# Patient Record
Sex: Male | Born: 2000 | Race: Black or African American | Hispanic: No | Marital: Single | Smoking: Never smoker
Health system: Southern US, Community
[De-identification: ages and names within clinical notes are randomized; demographics above are authoritative.]

## PROBLEM LIST (undated history)

## (undated) DIAGNOSIS — J45909 Unspecified asthma, uncomplicated: Secondary | ICD-10-CM

---
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
## 2018-04-08 ENCOUNTER — Other Ambulatory Visit: Payer: Self-pay

## 2018-04-08 ENCOUNTER — Emergency Department (HOSPITAL_COMMUNITY)
Admission: EM | Admit: 2018-04-08 | Discharge: 2018-04-08 | Disposition: A | Discharge status: Home / Self Care | Payer: Self-pay | Attending: Emergency Medicine | Admitting: Emergency Medicine

## 2018-04-08 ENCOUNTER — Encounter (HOSPITAL_COMMUNITY): Payer: Self-pay

## 2018-04-08 DIAGNOSIS — J45909 Unspecified asthma, uncomplicated: Secondary | ICD-10-CM | POA: Insufficient documentation

## 2018-04-08 DIAGNOSIS — R69 Illness, unspecified: Secondary | ICD-10-CM

## 2018-04-08 DIAGNOSIS — J111 Influenza due to unidentified influenza virus with other respiratory manifestations: Secondary | ICD-10-CM | POA: Insufficient documentation

## 2018-04-08 LAB — GROUP A STREP BY PCR: Group A Strep by PCR: NOT DETECTED

## 2018-04-08 MED ORDER — ACETAMINOPHEN 325 MG PO TABS
650.0000 mg | ORAL_TABLET | Freq: Once | ORAL | Status: AC | PRN
  Administered 2018-04-08: 650 mg via ORAL
  Filled 2018-04-08: qty 2

## 2018-04-08 NOTE — ED Triage Notes (Signed)
Patient c/o fever, body aches, cough, and sore throat x 4 days.

## 2018-04-08 NOTE — ED Provider Notes (Signed)
[facility]-EMERGENCY DEPT Provider Note   CSN: [id number] Arrival date & time: 04/08/18  0940     History   Chief Complaint Chief Complaint  Patient presents with  . Generalized Body Aches  . Fever  . Cough  . Sore Throat    HPI Kent Kramer is a 18 y.o. male.  The history is provided by the patient.  Fever  Max temp prior to arrival:  101.4 Temp source:  Oral Severity:  Moderate Onset quality:  Gradual Duration:  4 days Timing:  Constant Progression:  Unchanged Chronicity:  New Relieved by:  Acetaminophen Worsened by:  Nothing Ineffective treatments:  None tried Associated symptoms: chills, congestion, cough, diarrhea, myalgias, rhinorrhea and sore throat   Associated symptoms: no ear pain and no vomiting   Risk factors: sick contacts   Risk factors: no recent travel   Cough  Associated symptoms: chills, fever, myalgias, rhinorrhea and sore throat   Associated symptoms: no ear pain   Sore Throat     Past Medical History:  Diagnosis Date  . Asthma     There are no active problems to display for this patient.   History reviewed. No pertinent surgical history.      Home Medications    Prior to Admission medications   Medication Sig Start Date End Date Taking? Authorizing Provider  ibuprofen (ADVIL,MOTRIN) 800 MG tablet Take 1 tablet (800 mg total) by mouth 3 (three) times daily. 10/08/13   Jillyn LedgerPalmer, Jessica K, PA-C    Family History Family History  Problem Relation Age of Onset  . Heart failure Other     Social History Social History   Tobacco Use  . Smoking status: Never Smoker  . Smokeless tobacco: Never Used  Substance Use Topics  . Alcohol use: No  . Drug use: Never     Allergies   Penicillins   Review of Systems Review of Systems  Constitutional: Positive for chills and fever.  HENT: Positive for congestion, rhinorrhea and sore throat. Negative for ear pain.   Respiratory: Positive for cough.     Gastrointestinal: Positive for diarrhea. Negative for vomiting.  Musculoskeletal: Positive for myalgias.  All other systems reviewed and are negative.    Physical Exam Updated Vital Signs BP (!) 119/51 (BP Location: Left Arm)   Pulse 92   Temp (!) 101.4 F (38.6 C) (Oral)   Resp 16   Ht 5\' 9"  (1.753 m)   Wt 136.1 kg   SpO2 100%   BMI 44.30 kg/m   Physical Exam Vitals signs and nursing note reviewed.  Constitutional:      General: He is not in acute distress.    Appearance: He is well-developed.  HENT:     Head: Normocephalic and atraumatic.     Right Ear: Tympanic membrane normal.     Nose: Congestion and rhinorrhea present.     Mouth/Throat:     Mouth: Mucous membranes are moist.     Tonsils: Tonsillar exudate present. Swelling: 1+ on the right. 1+ on the left.  Eyes:     Conjunctiva/sclera: Conjunctivae normal.     Pupils: Pupils are equal, round, and reactive to light.  Neck:     Musculoskeletal: Normal range of motion and neck supple.  Cardiovascular:     Rate and Rhythm: Normal rate and regular rhythm.     Heart sounds: No murmur.  Pulmonary:     Effort: Pulmonary effort is normal. No respiratory distress.     Breath sounds:  Normal breath sounds. No wheezing or rales.  Abdominal:     General: There is no distension.     Palpations: Abdomen is soft.     Tenderness: There is no abdominal tenderness. There is no guarding or rebound.  Musculoskeletal: Normal range of motion.        General: No tenderness.  Skin:    General: Skin is warm and dry.     Findings: No erythema or rash.  Neurological:     Mental Status: He is alert and oriented to person, place, and time.  Psychiatric:        Behavior: Behavior normal.      ED Treatments / Results  Labs (all labs ordered are listed, but only abnormal results are displayed) Labs Reviewed  GROUP A STREP BY PCR    EKG None  Radiology No results found.  Procedures Procedures (including critical care  time)  Medications Ordered in ED Medications  acetaminophen (TYLENOL) tablet 650 mg (has no administration in time range)     Initial Impression / Assessment and Plan / ED Course  I have reviewed the triage vital signs and the nursing notes.  Pertinent labs & imaging results that were available during my care of the patient were reviewed by me and considered in my medical decision making (see chart for details).    Pt with symptoms consistent with influenza vs strep.  Normal exam here but is febrile.  No signs of breathing difficulty  No signs of otitis or abnormal abdominal findings.   Rapid strep wnl.  Will continue antipyretica and rest and fluids and return for any further problems.  Treat for flu like illness   Final Clinical Impressions(s) / ED Diagnoses   Final diagnoses:  Influenza-like illness    ED Discharge Orders    None       Gwyneth Sprout, MD 04/08/18 [phone]

## 2018-04-08 NOTE — Discharge Instructions (Signed)
Continue to do Tylenol or ibuprofen for fever.  Drink plenty of fluids and rest.

## 2019-11-19 ENCOUNTER — Ambulatory Visit: Payer: Medicaid Other

## 2019-11-19 ENCOUNTER — Other Ambulatory Visit: Payer: Self-pay

## 2019-11-19 ENCOUNTER — Other Ambulatory Visit (HOSPITAL_COMMUNITY)
Admission: RE | Admit: 2019-11-19 | Discharge: 2019-11-19 | Disposition: A | Discharge status: Home / Self Care | Payer: Medicaid Other | Source: Ambulatory Visit | Attending: Infectious Diseases | Admitting: Infectious Diseases

## 2019-11-19 ENCOUNTER — Other Ambulatory Visit: Payer: Medicaid Other

## 2019-11-19 DIAGNOSIS — B20 Human immunodeficiency virus [HIV] disease: Secondary | ICD-10-CM | POA: Insufficient documentation

## 2019-11-19 LAB — CBC WITH DIFFERENTIAL/PLATELET
Basophils Relative: 0.6 %
WBC: 5.3 10*3/uL (ref 3.8–10.8)

## 2019-11-19 LAB — COMPLETE METABOLIC PANEL WITH GFR
ALT: 25 U/L (ref 9–46)
Sodium: 137 mmol/L (ref 135–146)

## 2019-11-20 LAB — URINE CYTOLOGY ANCILLARY ONLY
Chlamydia: NEGATIVE
Comment: NEGATIVE
Comment: NORMAL
Neisseria Gonorrhea: NEGATIVE

## 2019-11-20 LAB — URINALYSIS

## 2019-11-20 LAB — T-HELPER CELL (CD4) - (RCID CLINIC ONLY)
CD4 % Helper T Cell: 17 % — ABNORMAL LOW (ref 33–65)
CD4 T Cell Abs: 249 /uL — ABNORMAL LOW (ref 400–1790)

## 2019-12-04 LAB — HEPATITIS B SURFACE ANTIBODY,QUALITATIVE: Hep B S Ab: NONREACTIVE

## 2019-12-04 LAB — COMPLETE METABOLIC PANEL WITH GFR
AG Ratio: 1.3 (calc) (ref 1.0–2.5)
AST: 20 U/L (ref 10–40)
Albumin: 4.9 g/dL (ref 3.6–5.1)
Alkaline phosphatase (APISO): 48 U/L (ref 36–130)
BUN: 13 mg/dL (ref 7–25)
CO2: 28 mmol/L (ref 20–32)
Calcium: 10.1 mg/dL (ref 8.6–10.3)
Chloride: 100 mmol/L (ref 98–110)
Creat: 0.98 mg/dL (ref 0.60–1.35)
GFR, Est African American: 127 mL/min/{1.73_m2} (ref >=60)
GFR, Est Non African American: 110 mL/min/{1.73_m2} (ref >=60)
Globulin: 3.8 g/dL (calc) — ABNORMAL HIGH (ref 1.9–3.7)
Glucose, Bld: 97 mg/dL (ref 65–99)
Potassium: 4.4 mmol/L (ref 3.5–5.3)
Total Bilirubin: 0.5 mg/dL (ref 0.2–1.2)
Total Protein: 8.7 g/dL — ABNORMAL HIGH (ref 6.1–8.1)

## 2019-12-04 LAB — URINALYSIS
Bilirubin Urine: NEGATIVE
Glucose, UA: NEGATIVE
Hgb urine dipstick: NEGATIVE
Ketones, ur: NEGATIVE
Leukocytes,Ua: NEGATIVE
Nitrite: NEGATIVE
Specific Gravity, Urine: 1.027 (ref 1.001–1.03)
pH: 7.5 (ref 5.0–8.0)

## 2019-12-04 LAB — LIPID PANEL
Cholesterol: 184 mg/dL (ref <200)
HDL: 29 mg/dL — ABNORMAL LOW (ref >=40)
LDL Cholesterol (Calc): 134 mg/dL (calc) — ABNORMAL HIGH
Non-HDL Cholesterol (Calc): 155 mg/dL (calc) — ABNORMAL HIGH (ref <130)
Total CHOL/HDL Ratio: 6.3 (calc) — ABNORMAL HIGH (ref <5.0)
Triglycerides: 106 mg/dL (ref <150)

## 2019-12-04 LAB — CBC WITH DIFFERENTIAL/PLATELET
Absolute Monocytes: 498 cells/uL (ref 200–950)
Basophils Absolute: 32 cells/uL (ref 0–200)
Eosinophils Absolute: 48 cells/uL (ref 15–500)
Eosinophils Relative: 0.9 %
HCT: 45.7 % (ref 38.5–50.0)
Hemoglobin: 14.6 g/dL (ref 13.2–17.1)
Lymphs Abs: 1659 cells/uL (ref 850–3900)
MCH: 25.4 pg — ABNORMAL LOW (ref 27.0–33.0)
MCHC: 31.9 g/dL — ABNORMAL LOW (ref 32.0–36.0)
MCV: 79.5 fL — ABNORMAL LOW (ref 80.0–100.0)
MPV: 11.4 fL (ref 7.5–12.5)
Monocytes Relative: 9.4 %
Neutro Abs: 3063 cells/uL (ref 1500–7800)
Neutrophils Relative %: 57.8 %
Platelets: 241 10*3/uL (ref 140–400)
RBC: 5.75 10*6/uL (ref 4.20–5.80)
RDW: 12.9 % (ref 11.0–15.0)
Total Lymphocyte: 31.3 %

## 2019-12-04 LAB — HEPATITIS C ANTIBODY
Hepatitis C Ab: NONREACTIVE
SIGNAL TO CUT-OFF: 0.11 (ref <1.00)

## 2019-12-04 LAB — HIV-1/2 AB - DIFFERENTIATION
HIV-1 antibody: POSITIVE — AB
HIV-2 Ab: UNDETERMINED — AB

## 2019-12-04 LAB — QUANTIFERON-TB GOLD PLUS
Mitogen-NIL: 4.43 IU/mL
NIL: 0.01 IU/mL
QuantiFERON-TB Gold Plus: NEGATIVE
TB1-NIL: 0.01 IU/mL
TB2-NIL: 0.01 IU/mL

## 2019-12-04 LAB — HIV-1 RNA ULTRAQUANT REFLEX TO GENTYP+
HIV 1 RNA Quant: 50700 copies/mL — ABNORMAL HIGH
HIV-1 RNA Quant, Log: 4.71 Log copies/mL — ABNORMAL HIGH

## 2019-12-04 LAB — HLA B*5701: HLA-B*5701 w/rflx HLA-B High: NEGATIVE

## 2019-12-04 LAB — HIV ANTIBODY (ROUTINE TESTING W REFLEX): HIV 1&2 Ab, 4th Generation: REACTIVE — AB

## 2019-12-04 LAB — HEPATITIS B CORE ANTIBODY, TOTAL: Hep B Core Total Ab: NONREACTIVE

## 2019-12-04 LAB — HIV-1 GENOTYPE: HIV-1 Genotype: DETECTED — AB

## 2019-12-04 LAB — RPR: RPR Ser Ql: NONREACTIVE

## 2019-12-04 LAB — HEPATITIS B SURFACE ANTIGEN: Hepatitis B Surface Ag: NONREACTIVE

## 2019-12-04 LAB — HEPATITIS A ANTIBODY, TOTAL: Hepatitis A AB,Total: REACTIVE — AB

## 2019-12-09 ENCOUNTER — Other Ambulatory Visit: Payer: Self-pay

## 2019-12-09 ENCOUNTER — Telehealth: Payer: Self-pay | Admitting: Pharmacy Technician

## 2019-12-09 ENCOUNTER — Ambulatory Visit (INDEPENDENT_AMBULATORY_CARE_PROVIDER_SITE_OTHER): Payer: Medicaid Other | Admitting: Pharmacist

## 2019-12-09 ENCOUNTER — Other Ambulatory Visit: Payer: Self-pay | Admitting: Infectious Diseases

## 2019-12-09 ENCOUNTER — Encounter: Payer: Self-pay | Admitting: Infectious Diseases

## 2019-12-09 ENCOUNTER — Ambulatory Visit (INDEPENDENT_AMBULATORY_CARE_PROVIDER_SITE_OTHER): Payer: Medicaid Other | Admitting: Infectious Diseases

## 2019-12-09 VITALS — BP 151/95 | HR 120 | Temp 98.5°F | Ht 69.0 in | Wt 287.0 lb

## 2019-12-09 DIAGNOSIS — B20 Human immunodeficiency virus [HIV] disease: Secondary | ICD-10-CM

## 2019-12-09 MED ORDER — BICTEGRAVIR-EMTRICITAB-TENOFOV 50-200-25 MG PO TABS: 1.0000 | ORAL_TABLET | Freq: Every day | ORAL | 5 refills | Status: DC

## 2019-12-09 MED ORDER — BICTEGRAVIR-EMTRICITAB-TENOFOV 50-200-25 MG PO TABS: 1.0000 | ORAL_TABLET | Freq: Every day | ORAL | 11 refills | Status: DC

## 2019-12-09 MED FILL — BIKTARVY 50-200-25 MG TABS: 50-200-25 | 30 days supply | Qty: 30 | Fill #0

## 2019-12-09 NOTE — Progress Notes (Signed)
HPI: Kent Kramer is a 19 y.o. male who presents to the [facility] clinic today to initiate care for a newly diagnosed HIV infection.  There are no problems to display for this patient.   Patient's Medications  New Prescriptions   No medications on file  Previous Medications   BICTEGRAVIR-EMTRICITABINE-TENOFOVIR AF (BIKTARVY) 50-200-25 MG TABS TABLET    Take 1 tablet by mouth daily.  Modified Medications   No medications on file  Discontinued Medications   No medications on file    Allergies: Allergies  Allergen Reactions  . Penicillins Itching and Swelling    Past Medical History: Past Medical History:  Diagnosis Date  . Asthma     Social History: Social History   Socioeconomic History  . Marital status: Single    Spouse name: Not on file  . Number of children: Not on file  . Years of education: Not on file  . Highest education level: Not on file  Occupational History  . Not on file  Tobacco Use  . Smoking status: Never Smoker  . Smokeless tobacco: Never Used  Vaping Use  . Vaping Use: Never used  Substance and Sexual Activity  . Alcohol use: No  . Drug use: Never  . Sexual activity: Not Currently    Comment: pt declined condoms 12/09/19  Other Topics Concern  . Not on file  Social History Narrative  . Not on file   Social Determinants of Health   Financial Resource Strain:   . Difficulty of Paying Living Expenses: Not on file  Food Insecurity:   . Worried About Programme researcher, broadcasting/film/video in the Last Year: Not on file  . Ran Out of Food in the Last Year: Not on file  Transportation Needs:   . Lack of Transportation (Medical): Not on file  . Lack of Transportation (Non-Medical): Not on file  Physical Activity:   . Days of Exercise per Week: Not on file  . Minutes of Exercise per Session: Not on file  Stress:   . Feeling of Stress : Not on file  Social Connections:   . Frequency of Communication with Friends and Family: Not on file  . Frequency of Social  Gatherings with Friends and Family: Not on file  . Attends Religious Services: Not on file  . Active Member of Clubs or Organizations: Not on file  . Attends Banker Meetings: Not on file  . Marital Status: Not on file    Labs: Lab Results  Component Value Date   HIV1RNAQUANT 50,700 (H) 11/19/2019   CD4TABS 249 (L) 11/19/2019    RPR and STI Lab Results  Component Value Date   LABRPR NON-REACTIVE 11/19/2019    STI Results GC CT  11/19/2019 Negative Negative    Hepatitis B Lab Results  Component Value Date   HEPBSAB NON-REACTIVE 11/19/2019   HEPBSAG NON-REACTIVE 11/19/2019   HEPBCAB NON-REACTIVE 11/19/2019   Hepatitis C Lab Results  Component Value Date   HEPCAB NON-REACTIVE 11/19/2019   Hepatitis A Lab Results  Component Value Date   HAV REACTIVE (A) 11/19/2019   Lipids: Lab Results  Component Value Date   CHOL 184 11/19/2019   TRIG 106 11/19/2019   HDL 29 (L) 11/19/2019   CHOLHDL 6.3 (H) 11/19/2019   LDLCALC 134 (H) 11/19/2019    Current HIV Regimen: Treatment naive  Assessment: Kent Kramer is here today to initiate care with Dr. Elinor Parkinson for his newly diagnosed HIV infection.  He is treatment naive with an initial  HIV viral load of 50,700 and a CD4 count of 249.  No resistance mutations found on initial genotype. Will start patient on Biktarvy.  Explained that Kent Kramer is a one pill once daily medication with or without food and the importance of not missing any doses. Explained resistance and how it develops and why it is so important to take Biktarvy daily and not skip days or doses. Counseled patient to take it around the same time each day. Counseled on what to do if dose is missed, if closer to missed dose take immediately, if closer to next dose then skip and resume normal schedule.   Cautioned on possible side effects the first week or so including nausea, diarrhea, dizziness, and headaches but that they should resolve after the first couple of  weeks. I reviewed patient medications and found no drug interactions. Counseled patient to separate Biktarvy from divalent cations including multivitamins. Discussed with patient to call clinic if he starts a new medication or herbal supplement. I gave the patient my card and told him to call me with any issues/questions/concerns.  Plan: - Start Biktarvy - Pick up at [facility] L. Emery Binz, PharmD, BCIDP, AAHIVP, CPP Clinical Pharmacist Practitioner Infectious Diseases Clinical Pharmacist [facility] 12/09/2019, 3:41 PM

## 2019-12-09 NOTE — Telephone Encounter (Signed)
[facility] Patient Product/process development scientist completed.    The patient is insured through [facility] and has a $3 copay.   Netty Starring. Dimas Aguas CPhT Specialty Pharmacy Patient [facility] Phone: [phone] Fax:  [phone]

## 2019-12-09 NOTE — Progress Notes (Addendum)
[facility], [place], [place], [address]                                                                  Phn. [phone]; Fax: [phone]                                                                             Date: 12/09/19   Reason for Visit: HIV follow up Primary Care provider: Dorita Sciara   HPI: Kent Kramer is a 19 y.o.old male with Obesity who is here for new HIV diagnosis. 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 He says he was sexually active females as a " one night stand" previously. He denies having male partners in the past. Last time sexually active was last year. He denies smoking, drinking alcohol occasionally and denies using any form of drugs. He lives in [place] but stays in [place] for his college. He is here with a male who he says is his " cousin sister".  Patient says he has been vaccinated with 2 doses of Moderna vaccine.   ROS: Denies dysphagia, odynophagia, cough, fever, nausea, vomiting, diarrhea, constipation, weight loss, chills, night sweats, recent hospitalizations, rashes, joint complaints, shortness of breath, headaches, chest pain, abdominal pain, dysuria .  10/26/19 [facility] HIV 1 and 2 antibody ( Rapid): HIV 1 HIV 4th generation ag/ab reactive for p24 antigen   Medications: None   Allergies  Allergen Reactions  . Penicillins Itching and Swelling   Past Medical History:  Diagnosis Date  . Asthma    Social History   Socioeconomic History  . Marital status: Single    Spouse name: Not on file  . Number of children: Not on file  . Years of education: Not on file  . Highest education level: Not on file  Occupational History  . Not on file  Tobacco Use   . Smoking status: Never Smoker  . Smokeless tobacco: Never Used  Vaping Use  . Vaping Use: Never used  Substance and Sexual Activity  . Alcohol use: No  . Drug use: Never  . Sexual activity: Not Currently    Comment: pt declined condoms 12/09/19  Other Topics Concern  . Not on file  Social History Narrative  . Not on file   Social Determinants of Health   Financial Resource Strain:   . Difficulty of Paying Living Expenses: Not on file  Food Insecurity:   . Worried About Charity fundraiser in the Last Year: Not on file  . Ran Out  of Food in the Last Year: Not on file  Transportation Needs:   . Lack of Transportation (Medical): Not on file  . Lack of Transportation (Non-Medical): Not on file  Physical Activity:   . Days of Exercise per Week: Not on file  . Minutes of Exercise per Session: Not on file  Stress:   . Feeling of Stress : Not on file  Social Connections:   . Frequency of Communication with Friends and Family: Not on file  . Frequency of Social Gatherings with Friends and Family: Not on file  . Attends Religious Services: Not on file  . Active Member of Clubs or Organizations: Not on file  . Attends Archivist Meetings: Not on file  . Marital Status: Not on file  Intimate Partner Violence:   . Fear of Current or Ex-Partner: Not on file  . Emotionally Abused: Not on file  . Physically Abused: Not on file  . Sexually Abused: Not on file   Family History  Problem Relation Age of Onset  . Heart failure Other     Vitals  BP (!) 151/95   Temp 98.5 F (36.9 C) (Oral)   Ht '5\' 9"'  (1.753 m)   Wt 287 lb (130.2 kg)   SpO2 100%   BMI 42.38 kg/m    Examination  Gen: Alert and oriented x 3, no acute distress, obese  HEENT: [place]/AT, PERL, no scleral icterus, no pale conjunctivae, hearing normal, oral mucosa moist Neck: Supple, no lymphadenopathy Cardio: Regular rate and rhythm; +S1 and S2; no murmurs, gallops, or rubs Resp: CTAB; no wheezes, rhonchi, or  rales GI: Soft, nontender, nondistended, bowel sounds present GU: not examined  Extremities: No cyanosis, clubbing, or edema; +2 PT and DP pulses Skin: No rashes, lesions, or ecchymoses Neuro: No focal deficits; CNs II-XII intact; sensation intact; +5/5 MMS b/l UEs and LEs Psych: Calm, cooperative  Lab Results HIV 1 RNA Quant (copies/mL)  Date Value  11/19/2019 50,700 (H)   CD4 T Cell Abs (/uL)  Date Value  11/19/2019 249 (L)   No results found for: HIV1GENOSEQ Lab Results  Component Value Date   WBC 5.3 11/19/2019   HGB 14.6 11/19/2019   HCT 45.7 11/19/2019   MCV 79.5 (L) 11/19/2019   PLT 241 11/19/2019    Lab Results  Component Value Date   CREATININE 0.98 11/19/2019   BUN 13 11/19/2019   NA 137 11/19/2019   K 4.4 11/19/2019   CL 100 11/19/2019   CO2 28 11/19/2019   Lab Results  Component Value Date   ALT 25 11/19/2019   AST 20 11/19/2019   BILITOT 0.5 11/19/2019    Lab Results  Component Value Date   CHOL 184 11/19/2019   TRIG 106 11/19/2019   HDL 29 (L) 11/19/2019   LDLCALC 134 (H) 11/19/2019   Lab Results  Component Value Date   HAV REACTIVE (A) 11/19/2019   Lab Results  Component Value Date   HEPBSAG NON-REACTIVE 11/19/2019   HEPBSAB NON-REACTIVE 11/19/2019   No results found for: HCVAB Lab Results  Component Value Date   CHLAMYDIAWP Negative 11/19/2019   N Negative 11/19/2019   No results found for: GCPROBEAPT No results found for: QUANTGOLD   Syphilis NR (11/19/19) Qunatiferon negative (11/19/2019) HLA B5701 negative (11/19/19   Health Maintenance: Immunization History  Administered Date(s) Administered  . DTaP, 5 pertussis antigens 12/08/1998, 02/07/1999, 04/06/1999, 01/26/2000  . HPV 9-valent 12/01/2014  . HPV Quadrivalent 11/20/2012, 10/26/2013  . Hepatitis A, Ped/Adol-2  Dose 11/20/2012, 10/26/2013  . Hepatitis B, ped/adol 11/11/1998, 11/14/1998, 04/06/1999  . HiB (PRP-OMP) 12/08/1998, 02/07/1999, 04/06/1999, 10/16/1999  . IPV  12/08/1998, 02/07/1999, 10/16/1999, 10/08/2002  . Influenza,inj,Quad PF,6+ Mos 03/06/2016  . MMRV 10/16/1999, 10/08/2002  . Meningococcal Conjugate 12/01/2014  . Meningococcal Polysaccharide 11/20/2012  . Moderna SARS-COVID-2 Vaccination 10/25/2019  . Pneumococcal-Unspecified 04/06/1999, 07/21/1999, 10/16/1999, 01/26/2000  . Tdap 11/20/2010     Assessment/Plan: HIV - Newly diagnosed in a heterosexual male Started on [place] 1 tab PO daily Last Cd4 249, no need for OI ppx Follow up in 4 weeks for VL  STD Screening  8/19 Urine GC and RPR is negative   Obesity Counseled on healthy diet and exercise  Patient's labs were reviewed as well as his previous records. Patients questions were addressed and answered. Safe sex counseling done.   Rosiland Oz, MD Infectious Diseases  Office phone [phone] Fax no. [phone]

## 2019-12-14 ENCOUNTER — Encounter: Payer: Self-pay | Admitting: Infectious Diseases

## 2019-12-31 MED FILL — BIKTARVY 50-200-25 MG TABS: 50-200-25 | 30 days supply | Qty: 30 | Fill #1

## 2020-01-11 ENCOUNTER — Ambulatory Visit (INDEPENDENT_AMBULATORY_CARE_PROVIDER_SITE_OTHER): Payer: Medicaid Other | Admitting: Infectious Diseases

## 2020-01-11 ENCOUNTER — Other Ambulatory Visit: Payer: Self-pay

## 2020-01-11 ENCOUNTER — Encounter: Payer: Self-pay | Admitting: Infectious Diseases

## 2020-01-11 VITALS — BP 152/93 | HR 109 | Wt 297.0 lb

## 2020-01-11 DIAGNOSIS — Z23 Encounter for immunization: Secondary | ICD-10-CM

## 2020-01-11 DIAGNOSIS — B20 Human immunodeficiency virus [HIV] disease: Secondary | ICD-10-CM | POA: Diagnosis not present

## 2020-01-11 MED ORDER — BICTEGRAVIR-EMTRICITAB-TENOFOV 50-200-25 MG PO TABS
1.0000 | ORAL_TABLET | Freq: Every day | ORAL | 5 refills | Status: DC
  Filled 2020-07-05: qty 30, 30d supply, fill #0
  Filled 2020-07-28: qty 30, 30d supply, fill #1

## 2020-01-11 NOTE — Addendum Note (Signed)
Addended by: Odette Fraction on: 01/11/2020 04:36 PM   Modules accepted: Orders

## 2020-01-11 NOTE — Progress Notes (Signed)
[address], [place], [place], [address]                                                                  Phn. [phone]; Fax: [phone]                                                                             Date:   Reason for Visit: HIV follow up Primary Care provider:   HPI: Kent Kramer is a 19 y.o.old male with a history of HIV who is here for a follow up. He is here with his cousin sister. He says that he has been taking Biktarvy every single day in the evening. Denies any missing doses or side effects. Denies any barriers to adherence of treatment. Denies being sexually active since being diagnosed with HIV. Denies condoms. Denies smoking, alcohol and using any illicit drugs. He is studying in [employer]. He is willing to get a FLU vaccine today and wants to get Hep B vaccine in next clinic visit. Overall feeling well and no complaints today.  ROS: Denies dysphagia, odynophagia, cough, fever, nausea, vomiting, diarrhea, constipation, weight loss, chills, night sweats, recent hospitalizations, rashes, joint complaints, shortness of breath, headaches, chest pain, abdominal pain, dysuria   Current Outpatient Medications on File Prior to Visit  Medication Sig Dispense Refill  . bictegravir-emtricitabine-tenofovir AF (BIKTARVY) 50-200-25 MG TABS tablet Take 1 tablet by mouth daily. 30 tablet 11   No current facility-administered medications on file prior to visit.     Allergies  Allergen Reactions  . Penicillins Itching and Swelling   Past Medical History:  Diagnosis Date  . Asthma    Social History   Socioeconomic History  . Marital status: Single    Spouse name: Not on file  . Number of children: Not on file  . Years of education: Not on file  . Highest  education level: Not on file  Occupational History  . Not on file  Tobacco Use  . Smoking status: Never Smoker  . Smokeless tobacco: Never Used  Vaping Use  . Vaping Use: Never used  Substance and Sexual Activity  . Alcohol use: No  . Drug use: Never  . Sexual activity: Not Currently    Comment: pt declined condoms 01/11/2020  Other Topics Concern  . Not on file  Social History Narrative  . Not on file   Social Determinants of Health   Financial Resource Strain:   . Difficulty of Paying Living Expenses: Not on file  Food Insecurity:   . Worried About Programme researcher, broadcasting/film/video in the Last Year: Not on file  .  Ran Out of Food in the Last Year: Not on file  Transportation Needs:   . Lack of Transportation (Medical): Not on file  . Lack of Transportation (Non-Medical): Not on file  Physical Activity:   . Days of Exercise per Week: Not on file  . Minutes of Exercise per Session: Not on file  Stress:   . Feeling of Stress : Not on file  Social Connections:   . Frequency of Communication with Friends and Family: Not on file  . Frequency of Social Gatherings with Friends and Family: Not on file  . Attends Religious Services: Not on file  . Active Member of Clubs or Organizations: Not on file  . Attends Banker Meetings: Not on file  . Marital Status: Not on file  Intimate Partner Violence:   . Fear of Current or Ex-Partner: Not on file  . Emotionally Abused: Not on file  . Physically Abused: Not on file  . Sexually Abused: Not on file    Vitals  BP (!) 152/93   Pulse (!) 109   Wt 297 lb (134.7 kg)   SpO2 100%   BMI 43.86 kg/m    Examination  Gen: Alert and oriented x 3, no acute distress, OBESE  HEENT: [place]/AT, PERL, no scleral icterus, no pale conjunctivae, hearing normal, oral mucosa moist, NO CANDIDIASIS Neck: Supple, no lymphadenopathy Cardio: Regular rate and rhythm; +S1 and S2; no murmurs, gallops, or rubs Resp: CTAB; no wheezes, rhonchi, or rales GI:  Soft, nontender, nondistended, bowel sounds present Extremities: No cyanosis, clubbing, or edema; +2 PT and DP pulses Skin: No rashes, lesions, or ecchymoses Neuro: No focal deficits Psych: Calm, cooperative  Lab Results HIV 1 RNA Quant (copies/mL)  Date Value  11/19/2019 50,700 (H)   CD4 T Cell Abs (/uL)  Date Value  11/19/2019 249 (L)   No results found for: HIV1GENOSEQ Lab Results  Component Value Date   WBC 5.3 11/19/2019   HGB 14.6 11/19/2019   HCT 45.7 11/19/2019   MCV 79.5 (L) 11/19/2019   PLT 241 11/19/2019    Lab Results  Component Value Date   CREATININE 0.98 11/19/2019   BUN 13 11/19/2019   NA 137 11/19/2019   K 4.4 11/19/2019   CL 100 11/19/2019   CO2 28 11/19/2019   Lab Results  Component Value Date   ALT 25 11/19/2019   AST 20 11/19/2019   BILITOT 0.5 11/19/2019    Lab Results  Component Value Date   CHOL 184 11/19/2019   TRIG 106 11/19/2019   HDL 29 (L) 11/19/2019   LDLCALC 134 (H) 11/19/2019   Lab Results  Component Value Date   HAV REACTIVE (A) 11/19/2019   Lab Results  Component Value Date   HEPBSAG NON-REACTIVE 11/19/2019   HEPBSAB NON-REACTIVE 11/19/2019   No results found for: HCVAB Lab Results  Component Value Date   CHLAMYDIAWP Negative 11/19/2019   N Negative 11/19/2019   No results found for: GCPROBEAPT No results found for: QUANTGOLD   Health Maintenance: Immunization History  Administered Date(s) Administered  . DTaP, 5 pertussis antigens 12/08/1998, 02/07/1999, 04/06/1999, 01/26/2000  . HPV 9-valent 12/01/2014  . HPV Quadrivalent 11/20/2012, 10/26/2013  . Hepatitis A, Ped/Adol-2 Dose 11/20/2012, 10/26/2013  . Hepatitis B, ped/adol 12-20-1998, 11/14/1998, 04/06/1999  . HiB (PRP-OMP) 12/08/1998, 02/07/1999, 04/06/1999, 10/16/1999  . IPV 12/08/1998, 02/07/1999, 10/16/1999, 10/08/2002  . Influenza,inj,Quad PF,6+ Mos 03/06/2016  . MMRV 10/16/1999, 10/08/2002  . Meningococcal Conjugate 12/01/2014  . Meningococcal  Polysaccharide 11/20/2012  .  Moderna SARS-COVID-2 Vaccination 10/25/2019  . Pneumococcal-Unspecified 04/06/1999, 07/21/1999, 10/16/1999, 01/26/2000  . Tdap 11/20/2010    Assessment/Plan: HIV Continue Biktarvy 1 tab po daily Fu in 4 weeks Will discuss about anal pap next visit Orders Placed This Encounter  Procedures  . Flu Vaccine QUAD 36+ mos IM  . HIV 1 RNA quant-no reflex-bld     Obesity/Hyperlipidemia Discussed about healthy life style and exercise  STD Screening  Recent urine GC negative with no recent sexual activity  Immunization Flu vaccine today Hep B vaccine next visit ( Vaccine non responder)  I spent greater than 25  minutes with the patient including greater than 50% of time in face to face counsel of the patient and in coordination of their care.   Patient's labs were reviewed as well as his previous records. Patients questions were addressed and answered. Safe sex counseling done.    Odette Fraction, MD Infectious Diseases  Office phone [phone] Fax no. [phone]

## 2020-01-13 LAB — HIV-1 RNA QUANT-NO REFLEX-BLD
HIV 1 RNA Quant: <20 Copies/mL — ABNORMAL HIGH
HIV-1 RNA Quant, Log: <1.3 Log cps/mL — ABNORMAL HIGH

## 2020-02-01 MED FILL — BIKTARVY 50-200-25 MG TABS: 50-200-25 | 30 days supply | Qty: 30 | Fill #2

## 2020-02-02 ENCOUNTER — Telehealth: Payer: Self-pay

## 2020-02-02 NOTE — Telephone Encounter (Signed)
Patient advised that his VL is undetectable per Dr. Elinor Parkinson. Patient verbalized understanding. Kent Kramer

## 2020-02-02 NOTE — Telephone Encounter (Signed)
-----   Message from Odette Fraction, MD sent at 02/02/2020  7:53 AM EDT ----- Please let patient know that his VL is undetectable now.

## 2020-02-22 ENCOUNTER — Other Ambulatory Visit: Payer: Self-pay

## 2020-02-22 ENCOUNTER — Encounter: Payer: Self-pay | Admitting: Infectious Diseases

## 2020-02-22 ENCOUNTER — Ambulatory Visit (INDEPENDENT_AMBULATORY_CARE_PROVIDER_SITE_OTHER): Payer: Medicaid Other | Admitting: Infectious Diseases

## 2020-02-22 VITALS — BP 146/90 | HR 101 | Wt 304.0 lb

## 2020-02-22 DIAGNOSIS — E669 Obesity, unspecified: Secondary | ICD-10-CM

## 2020-02-22 DIAGNOSIS — Z6841 Body Mass Index (BMI) 40.0 and over, adult: Secondary | ICD-10-CM

## 2020-02-22 DIAGNOSIS — Z23 Encounter for immunization: Secondary | ICD-10-CM | POA: Diagnosis not present

## 2020-02-22 DIAGNOSIS — B20 Human immunodeficiency virus [HIV] disease: Secondary | ICD-10-CM

## 2020-02-22 NOTE — Assessment & Plan Note (Signed)
Discussed about healthy life style Lipid panel today

## 2020-02-22 NOTE — Progress Notes (Signed)
[address], [place], [place], [address]                                                                  Phn. [phone]; Fax: [phone]                                                                             Date: 02/22/20  Reason for Visit: HIV follow up Primary Care provider:   HPI: Kent Kramer is a 19 y.o.old male with a history of HIV who is here for a follow up.   Patient is here with a male companions who he describes as his " cousin sister". He has been taking Biktarvy everyday without missing any doses. Denies any side effects. Denies any barriers to adherence of treatment. Denies being sexually active since diagnosis. Denies smoking, alcohol and using any drugs. He has a dentist who he is following soon for dental cleaning. He has stopped working because of transportation issues. Overall no complaints today.  ROS: Denies dysphagia, odynophagia, cough, fever, nausea, vomiting, diarrhea, constipation, weight loss, chills, night sweats, recent hospitalizations, rashes, joint complaints, shortness of breath, headaches, chest pain, abdominal pain, dysuria   Current Outpatient Medications on File Prior to Visit  Medication Sig Dispense Refill  . bictegravir-emtricitabine-tenofovir AF (BIKTARVY) 50-200-25 MG TABS tablet Take 1 tablet by mouth daily. 30 tablet 5   No current facility-administered medications on file prior to visit.     Allergies  Allergen Reactions  . Penicillins Itching and Swelling   Past Medical History:  Diagnosis Date  . Asthma    Social History   Socioeconomic History  . Marital status: Single    Spouse name: Not on file  . Number of children: Not on file  . Years of education: Not on file  . Highest education level: Not on file   Occupational History  . Not on file  Tobacco Use  . Smoking status: Never Smoker  . Smokeless tobacco: Never Used  Vaping Use  . Vaping Use: Never used  Substance and Sexual Activity  . Alcohol use: No  . Drug use: Never  . Sexual activity: Not Currently    Comment: pt declined condoms 01/11/2020  Other Topics Concern  . Not on file  Social History Narrative  . Not on file   Social Determinants of Health   Financial Resource Strain:   . Difficulty of Paying Living Expenses: Not on file  Food Insecurity:   . Worried About Programme researcher, broadcasting/film/video in the Last Year: Not on file  . Ran Out of Food in the Last Year: Not on file  Transportation Needs:   . Freight forwarder (Medical): Not on file  . Lack of Transportation (Non-Medical): Not on file  Physical Activity:   . Days of Exercise per Week: Not on file  . Minutes of Exercise per Session: Not on file  Stress:   . Feeling of Stress : Not on file  Social Connections:   . Frequency of Communication with Friends and Family: Not on file  . Frequency of Social Gatherings with Friends and Family: Not on file  . Attends Religious Services: Not on file  . Active Member of Clubs or Organizations: Not on file  . Attends Banker Meetings: Not on file  . Marital Status: Not on file  Intimate Partner Violence:   . Fear of Current or Ex-Partner: Not on file  . Emotionally Abused: Not on file  . Physically Abused: Not on file  . Sexually Abused: Not on file    Vitals  Wt (!) 304 lb (137.9 kg)   BMI 44.89 kg/m    Examination  Gen: Alert and oriented x 3, no acute distress, OBESE  HEENT: [place]/AT, PERL, no scleral icterus, no pale conjunctivae, hearing normal, oral mucosa moist, NO CANDIDIASIS Neck: Supple, no lymphadenopathy Cardio: Regular rate and rhythm; +S1 and S2; no murmurs, gallops, or rubs Resp: CTAB; no wheezes, rhonchi, or rales GI: Soft, nontender, nondistended, bowel sounds present Extremities: No  cyanosis, clubbing, or edema; +2 PT and DP pulses Skin: No rashes, lesions, or ecchymoses Neuro: No focal deficits Psych: Calm, cooperative  Lab Results HIV 1 RNA Quant  Date Value  01/11/2020 <20 Copies/mL (H)  11/19/2019 50,700 copies/mL (H)   CD4 T Cell Abs (/uL)  Date Value  11/19/2019 249 (L)   No results found for: HIV1GENOSEQ Lab Results  Component Value Date   WBC 5.3 11/19/2019   HGB 14.6 11/19/2019   HCT 45.7 11/19/2019   MCV 79.5 (L) 11/19/2019   PLT 241 11/19/2019    Lab Results  Component Value Date   CREATININE 0.98 11/19/2019   BUN 13 11/19/2019   NA 137 11/19/2019   K 4.4 11/19/2019   CL 100 11/19/2019   CO2 28 11/19/2019   Lab Results  Component Value Date   ALT 25 11/19/2019   AST 20 11/19/2019   BILITOT 0.5 11/19/2019    Lab Results  Component Value Date   CHOL 184 11/19/2019   TRIG 106 11/19/2019   HDL 29 (L) 11/19/2019   LDLCALC 134 (H) 11/19/2019   Lab Results  Component Value Date   HAV REACTIVE (A) 11/19/2019   Lab Results  Component Value Date   HEPBSAG NON-REACTIVE 11/19/2019   HEPBSAB NON-REACTIVE 11/19/2019   No results found for: HCVAB Lab Results  Component Value Date   CHLAMYDIAWP Negative 11/19/2019   N Negative 11/19/2019   No results found for: GCPROBEAPT No results found for: QUANTGOLD   Health Maintenance: Immunization History  Administered Date(s) Administered  . DTaP, 5 pertussis antigens 12/08/1998, 02/07/1999, 04/06/1999, 01/26/2000  . HPV 9-valent 12/01/2014  . HPV Quadrivalent 11/20/2012, 10/26/2013  . Hepatitis A, Ped/Adol-2 Dose 11/20/2012, 10/26/2013  . Hepatitis B, ped/adol Apr 19, 1998, 11/14/1998, 04/06/1999  . HiB (PRP-OMP) 12/08/1998, 02/07/1999, 04/06/1999, 10/16/1999  . IPV 12/08/1998, 02/07/1999, 10/16/1999, 10/08/2002  . Influenza,inj,Quad PF,6+ Mos 03/06/2016, 01/11/2020  . MMRV 10/16/1999, 10/08/2002  . Meningococcal Conjugate 12/01/2014  . Meningococcal Polysaccharide 11/20/2012  .  Moderna SARS-COVID-2 Vaccination 10/25/2019  . Pneumococcal-Unspecified 04/06/1999, 07/21/1999, 10/16/1999, 01/26/2000  . Tdap 11/20/2010  Assessment/Plan: HIV Continue Biktarvy 1 tab po daily Fu in 2 months     Obesity/Hyperlipidemia Discussed about healthy life style and exercise Lipid panel today   STD Screening  Recent urine GC negative with no recent sexual activity  Immunization Hepatitis B Vaccine 1st dose today   I spent greater than 20  minutes with the patient including greater than 50% of time in face to face counsel of the patient and in coordination of their care.   Patient's labs were reviewed as well as his previous records. Patients questions were addressed and answered. Safe sex counseling done.    Odette Fraction, MD Infectious Diseases  Office phone [phone] Fax no. [phone]

## 2020-02-22 NOTE — Assessment & Plan Note (Signed)
Continue BIktarvy 1 tab po daily Fu in 2 months

## 2020-02-28 LAB — HIV-1 RNA ULTRAQUANT REFLEX TO GENTYP+
HIV 1 RNA Quant: <20 copies/mL
HIV-1 RNA Quant, Log: <1.3 Log copies/mL

## 2020-02-28 LAB — LIPID PANEL
Cholesterol: 204 mg/dL — ABNORMAL HIGH (ref <200)
HDL: 36 mg/dL — ABNORMAL LOW (ref >=40)
LDL Cholesterol (Calc): 147 mg/dL (calc) — ABNORMAL HIGH
Non-HDL Cholesterol (Calc): 168 mg/dL (calc) — ABNORMAL HIGH (ref <130)
Total CHOL/HDL Ratio: 5.7 (calc) — ABNORMAL HIGH (ref <5.0)
Triglycerides: 98 mg/dL (ref <150)

## 2020-02-29 ENCOUNTER — Telehealth: Payer: Self-pay

## 2020-02-29 MED FILL — BIKTARVY 50-200-25 MG TABS: 50-200-25 | 30 days supply | Qty: 30 | Fill #3

## 2020-02-29 NOTE — Telephone Encounter (Signed)
Spoke with the patient to relay lab results per Dr. Elinor Parkinson. Congratulated the patient on an undetectable viral load. Informed patient that his LDL cholesterol is higher than we would like. Advised patient to focus on eating healthy, incorporating more fruits and vegetables and limiting fried foods and sweet drinks. Advised patient to start exercising 3 times a week, each time 30-45 minutes. Patient verbalized understanding and has no further questions.   Sandie Ano, RN

## 2020-02-29 NOTE — Telephone Encounter (Signed)
-----   Message from Odette Fraction, MD sent at 02/29/2020  7:39 AM EST ----- Viral load is undetectable but LDL is high.   Please inform patient about the results.   He will need to eat healthy diet( more fruits, vegetables, less fried food, sodas, sweet drinks etc) and start exercising at least 3 times a week ( each time at least 30-50minutes).

## 2020-02-29 NOTE — Telephone Encounter (Signed)
Attempted to call patient. No answer, voicemail box not set up. Will attempt to call again later.   Sandie Ano, RN

## 2020-04-04 MED FILL — BIKTARVY 50-200-25 MG TABS: 50-200-25 | 30 days supply | Qty: 30 | Fill #4

## 2020-04-12 ENCOUNTER — Ambulatory Visit: Payer: Medicaid Other | Admitting: Infectious Diseases

## 2020-04-12 ENCOUNTER — Ambulatory Visit: Payer: Medicaid Other

## 2020-04-26 ENCOUNTER — Ambulatory Visit (INDEPENDENT_AMBULATORY_CARE_PROVIDER_SITE_OTHER): Payer: Medicaid Other | Admitting: Infectious Diseases

## 2020-04-26 ENCOUNTER — Ambulatory Visit: Payer: Medicaid Other

## 2020-04-26 ENCOUNTER — Other Ambulatory Visit (HOSPITAL_COMMUNITY)
Admission: RE | Admit: 2020-04-26 | Discharge: 2020-04-26 | Disposition: A | Discharge status: Home / Self Care | Payer: Medicaid Other | Source: Ambulatory Visit | Attending: Infectious Diseases | Admitting: Infectious Diseases

## 2020-04-26 ENCOUNTER — Encounter: Payer: Self-pay | Admitting: Infectious Diseases

## 2020-04-26 ENCOUNTER — Other Ambulatory Visit: Payer: Self-pay

## 2020-04-26 VITALS — BP 153/86 | HR 105 | Temp 98.3°F | Wt 301.0 lb

## 2020-04-26 DIAGNOSIS — B20 Human immunodeficiency virus [HIV] disease: Secondary | ICD-10-CM

## 2020-04-26 DIAGNOSIS — Z23 Encounter for immunization: Secondary | ICD-10-CM

## 2020-04-26 NOTE — Progress Notes (Signed)
.[facility]                                                                                                                                                                                                       [address], [place], [place], [address]                                                                  Phn. [phone]; Fax: [phone]                                                                             Date: 04/26/20  Reason for Visit: HIV follow up   HPI: Kent Kramer is a 20 y.o.old male with a history of HIV who is here for a follow up. He is accompanid by his male cousin who always comes with him. Denies missing doses.  No side effects or barriers to adherence of treatment  Denies being sexually active since diagnosis. 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 He wants to see a dentist. Overall he feels well.he is also watching his diet and trying to eat healthy. He says he walks in the college and has losf approx 7 lbs. No other complaints today   ROS: Denies dysphagia, odynophagia, cough, fever, nausea, vomiting, diarrhea, constipation, weight loss, chills, night sweats, recent hospitalizations, rashes, joint complaints, shortness of breath, headaches, chest pain, abdominal pain, dysuria   Current Outpatient Medications on File Prior to Visit  Medication Sig Dispense Refill  . bictegravir-emtricitabine-tenofovir AF (BIKTARVY) 50-200-25 MG TABS tablet Take 1 tablet by mouth daily. 30 tablet 5   No current facility-administered medications on file prior to visit.     Allergies  Allergen Reactions  . Penicillins Itching and Swelling   Past Medical History:  Diagnosis Date  . Asthma    Social History   Socioeconomic History  . Marital status: Single    Spouse name: Not on file  . Number of children: Not on  file  . Years of education: Not on file  . Highest education level: Not on file   Occupational History  . Not on file  Tobacco Use  . Smoking status: Never Smoker  . Smokeless tobacco: Never Used  Vaping Use  . Vaping Use: Never used  Substance and Sexual Activity  . Alcohol use: No  . Drug use: Never  . Sexual activity: Not Currently    Comment: pt declined condoms 04/26/20  Other Topics Concern  . Not on file  Social History Narrative  . Not on file   Social Determinants of Health   Financial Resource Strain: Not on file  Food Insecurity: Not on file  Transportation Needs: Not on file  Physical Activity: Not on file  Stress: Not on file  Social Connections: Not on file  Intimate Partner Violence: Not on file    Vitals  BP (!) 153/86   Pulse (!) 105   Temp 98.3 F (36.8 C) (Oral)   Wt (!) 301 lb (136.5 kg)   SpO2 100%   BMI 44.45 kg/m    Examination  Gen: Alert and oriented x 3, no acute distress, OBESE  HEENT: [place]/AT, PERL, no scleral icterus, no pale conjunctivae, hearing normal, oral mucosa moist, NO CANDIDIASIS Neck: Supple, no lymphadenopathy Cardio: Regular rate and rhythm; +S1 and S2; no murmurs, gallops, or rubs Resp: CTAB; no wheezes, rhonchi, or rales GI: Soft, nontender, nondistended, bowel sounds present Extremities: No cyanosis, clubbing, or edema; +2 PT and DP pulses Skin: No rashes, lesions, or ecchymoses Neuro: No focal deficits Psych: Calm, cooperative  Lab Results HIV 1 RNA Quant  Date Value  02/22/2020 <20 DETECTED copies/mL  01/11/2020 <20 Copies/mL (H)  11/19/2019 50,700 copies/mL (H)   CD4 T Cell Abs (/uL)  Date Value  11/19/2019 249 (L)   No results found for: HIV1GENOSEQ Lab Results  Component Value Date   WBC 5.3 11/19/2019   HGB 14.6 11/19/2019   HCT 45.7 11/19/2019   MCV 79.5 (L) 11/19/2019   PLT 241 11/19/2019    Lab Results  Component Value Date   CREATININE 0.98 11/19/2019   BUN 13 11/19/2019   NA 137 11/19/2019   K 4.4 11/19/2019   CL 100 11/19/2019   CO2 28 11/19/2019   Lab Results   Component Value Date   ALT 25 11/19/2019   AST 20 11/19/2019   BILITOT 0.5 11/19/2019    Lab Results  Component Value Date   CHOL 204 (H) 02/22/2020   TRIG 98 02/22/2020   HDL 36 (L) 02/22/2020   LDLCALC 147 (H) 02/22/2020   Lab Results  Component Value Date   HAV REACTIVE (A) 11/19/2019   Lab Results  Component Value Date   HEPBSAG NON-REACTIVE 11/19/2019   HEPBSAB NON-REACTIVE 11/19/2019   No results found for: HCVAB Lab Results  Component Value Date   CHLAMYDIAWP Negative 11/19/2019   N Negative 11/19/2019   No results found for: GCPROBEAPT No results found for: QUANTGOLD   Health Maintenance: Immunization History  Administered Date(s) Administered  . DTaP, 5 pertussis antigens 12/08/1998, 02/07/1999, 04/06/1999, 01/26/2000  . HPV 9-valent 12/01/2014  . HPV Quadrivalent 11/20/2012, 10/26/2013  . Hepatitis A, Ped/Adol-2 Dose 11/20/2012, 10/26/2013  . Hepatitis B, ped/adol Dec 21, 1998, 11/14/1998, 04/06/1999  . Hepb-cpg 02/22/2020  . HiB (PRP-OMP) 12/08/1998, 02/07/1999, 04/06/1999, 10/16/1999  . IPV 12/08/1998, 02/07/1999, 10/16/1999, 10/08/2002  . Influenza,inj,Quad PF,6+ Mos 03/06/2016, 01/11/2020  . Influenza-Unspecified 01/11/2020  . MMRV 10/16/1999, 10/08/2002  . Meningococcal Conjugate 12/01/2014  .  Meningococcal Polysaccharide 11/20/2012  . Moderna Sars-Covid-2 Vaccination 10/25/2019, 11/23/2019  . Pneumococcal-Unspecified 04/06/1999, 07/21/1999, 10/16/1999, 01/26/2000  . Tdap 11/20/2010    Assessment/Plan: HIV Continue Biktarvy 1 tab po daily Fu in 3 months Orders Placed This Encounter  Procedures  . Heplisav-B (HepB-CPG) Vaccine  . HIV-1 RNA ultraquant reflex to gentyp+  . T-helper cell (CD4)- ([facility] clinic only)  . CBC  . Comprehensive metabolic panel  . RPR     Obesity/Hyperlipidemia Reinforced on healthy life style and exercise. He is working on it.    STD Screening  Urine GC and RPR   Immunization Hepatitis B Vaccine 2nd dose    Health Maintenance  Dental referral placed   I spent greater than 25 minutes with the patient including greater than 50% of time in face to face counsel of the patient and in coordination of their care.   Patient's labs were reviewed as well as his previous records. Patients questions were addressed and answered. Safe sex counseling done.    Odette Fraction, MD Infectious Diseases  Office phone [phone] Fax no. [phone]

## 2020-04-27 LAB — URINE CYTOLOGY ANCILLARY ONLY
Chlamydia: NEGATIVE
Comment: NEGATIVE
Comment: NORMAL
Neisseria Gonorrhea: NEGATIVE

## 2020-04-27 LAB — T-HELPER CELL (CD4) - (RCID CLINIC ONLY)
CD4 % Helper T Cell: 31 % — ABNORMAL LOW (ref 33–65)
CD4 T Cell Abs: 533 /uL (ref 400–1790)

## 2020-04-30 LAB — COMPREHENSIVE METABOLIC PANEL
AG Ratio: 1.5 (calc) (ref 1.0–2.5)
ALT: 20 U/L (ref 9–46)
AST: 16 U/L (ref 10–40)
Albumin: 4.6 g/dL (ref 3.6–5.1)
Alkaline phosphatase (APISO): 48 U/L (ref 36–130)
BUN: 9 mg/dL (ref 7–25)
CO2: 29 mmol/L (ref 20–32)
Calcium: 9.8 mg/dL (ref 8.6–10.3)
Chloride: 102 mmol/L (ref 98–110)
Creat: 1.04 mg/dL (ref 0.60–1.35)
Globulin: 3 g/dL (calc) (ref 1.9–3.7)
Glucose, Bld: 112 mg/dL — ABNORMAL HIGH (ref 65–99)
Potassium: 4.1 mmol/L (ref 3.5–5.3)
Sodium: 140 mmol/L (ref 135–146)
Total Bilirubin: 0.5 mg/dL (ref 0.2–1.2)
Total Protein: 7.6 g/dL (ref 6.1–8.1)

## 2020-04-30 LAB — CBC
HCT: 42 % (ref 38.5–50.0)
Hemoglobin: 13.6 g/dL (ref 13.2–17.1)
MCH: 25.9 pg — ABNORMAL LOW (ref 27.0–33.0)
MCHC: 32.4 g/dL (ref 32.0–36.0)
MCV: 80 fL (ref 80.0–100.0)
MPV: 10 fL (ref 7.5–12.5)
Platelets: 299 10*3/uL (ref 140–400)
RBC: 5.25 10*6/uL (ref 4.20–5.80)
RDW: 12.6 % (ref 11.0–15.0)
WBC: 5.6 10*3/uL (ref 3.8–10.8)

## 2020-04-30 LAB — RPR: RPR Ser Ql: NONREACTIVE

## 2020-04-30 LAB — HIV-1 RNA ULTRAQUANT REFLEX TO GENTYP+
HIV 1 RNA Quant: <20 copies/mL
HIV-1 RNA Quant, Log: <1.3 Log copies/mL

## 2020-05-05 MED FILL — BIKTARVY 50-200-25 MG TABS: 50-200-25 | 30 days supply | Qty: 30 | Fill #5

## 2020-06-02 MED FILL — BIKTARVY 50-200-25 MG TABS: 50-200-25 | 30 days supply | Qty: 30 | Fill #6

## 2020-06-30 ENCOUNTER — Other Ambulatory Visit (HOSPITAL_COMMUNITY): Payer: Self-pay

## 2020-07-02 ENCOUNTER — Other Ambulatory Visit (HOSPITAL_COMMUNITY): Payer: Self-pay

## 2020-07-05 ENCOUNTER — Other Ambulatory Visit (HOSPITAL_COMMUNITY): Payer: Self-pay

## 2020-07-06 ENCOUNTER — Other Ambulatory Visit (HOSPITAL_COMMUNITY): Payer: Self-pay

## 2020-07-07 ENCOUNTER — Other Ambulatory Visit (HOSPITAL_COMMUNITY): Payer: Self-pay

## 2020-07-25 ENCOUNTER — Other Ambulatory Visit (HOSPITAL_COMMUNITY): Payer: Self-pay

## 2020-07-27 ENCOUNTER — Ambulatory Visit: Payer: Medicaid Other | Admitting: Infectious Diseases

## 2020-07-28 ENCOUNTER — Other Ambulatory Visit (HOSPITAL_COMMUNITY): Payer: Self-pay

## 2020-08-04 ENCOUNTER — Other Ambulatory Visit (HOSPITAL_COMMUNITY): Payer: Self-pay

## 2020-08-10 ENCOUNTER — Ambulatory Visit (INDEPENDENT_AMBULATORY_CARE_PROVIDER_SITE_OTHER): Payer: Medicaid Other | Admitting: Infectious Diseases

## 2020-08-10 ENCOUNTER — Other Ambulatory Visit (HOSPITAL_COMMUNITY): Payer: Self-pay

## 2020-08-10 ENCOUNTER — Other Ambulatory Visit (HOSPITAL_COMMUNITY)
Admission: RE | Admit: 2020-08-10 | Discharge: 2020-08-10 | Disposition: A | Discharge status: Home / Self Care | Payer: Medicaid Other | Source: Ambulatory Visit | Attending: Infectious Diseases | Admitting: Infectious Diseases

## 2020-08-10 ENCOUNTER — Other Ambulatory Visit: Payer: Self-pay

## 2020-08-10 VITALS — Ht 69.0 in | Wt 286.0 lb

## 2020-08-10 DIAGNOSIS — Z113 Encounter for screening for infections with a predominantly sexual mode of transmission: Secondary | ICD-10-CM

## 2020-08-10 DIAGNOSIS — Z7185 Encounter for immunization safety counseling: Secondary | ICD-10-CM

## 2020-08-10 DIAGNOSIS — B20 Human immunodeficiency virus [HIV] disease: Secondary | ICD-10-CM | POA: Insufficient documentation

## 2020-08-10 MED ORDER — BICTEGRAVIR-EMTRICITAB-TENOFOV 50-200-25 MG PO TABS
1.0000 | ORAL_TABLET | Freq: Every day | ORAL | 3 refills | Status: DC
  Filled 2020-08-10 – 2020-08-11 (×2): qty 90, 90d supply, fill #0
  Filled 2020-11-21: qty 90, 90d supply, fill #1
  Filled 2020-12-21: qty 30, 30d supply, fill #2
  Filled 2020-12-28 – 2021-02-21 (×2): qty 90, 90d supply, fill #2
  Filled 2021-05-11: qty 90, 90d supply, fill #3

## 2020-08-10 NOTE — Progress Notes (Signed)
[address], [place], [place], [address]                                                                  Phn. [phone]; Fax: [phone]                                                                             Date: 08/09/20  Reason for Visit: HIV follow up   HPI: Kent Kramer is a 20 y.o.old male with a history of HIV who is here for a follow up. He is accompanid by his male cousin who always comes with him. Taking Biktarvy daily. Denies missing doses.  Denies barriers to adherence of treatment  Denies being sexually active and refused to get screened for STDs. Denies smoking, alcohol and using any drugs  He is studying criminal justice. He is going to go to his home in summer and wants 3 months supply of ART for that time period. He has lost approximately 16 lbs in the last 3 months or so. He has replaced his soda with water and also has started to walk mostly in his college. He is very happy about this and motivated to loose further weight. He thinks he can go down further on his weight. Denies any symptoms of sadness, depression or hopelessness.   ROS: Denies dysphagia, odynophagia, cough, fever, nausea, vomiting, diarrhea, constipation, weight loss, chills, night sweats, recent hospitalizations, rashes, joint complaints, shortness of breath, headaches, chest pain, abdominal pain, dysuria   Current Outpatient Medications on File Prior to Visit  Medication Sig Dispense Refill  . bictegravir-emtricitabine-tenofovir AF (BIKTARVY) 50-200-25 MG TABS tablet Take 1 tablet by mouth daily. 30 tablet 5   No current facility-administered medications on file prior to visit.     Allergies  Allergen Reactions  . Penicillins Itching  and Swelling   Past Medical History:  Diagnosis Date  .  Asthma    Social History   Socioeconomic History  . Marital status: Single    Spouse name: Not on file  . Number of children: Not on file  . Years of education: Not on file  . Highest education level: Not on file  Occupational History  . Not on file  Tobacco Use  . Smoking status: Never Smoker  . Smokeless tobacco: Never Used  Vaping Use  . Vaping Use: Never used  Substance and Sexual Activity  . Alcohol use: No  . Drug use: Never  . Sexual activity: Not Currently    Comment: pt declined condoms 04/26/20  Other Topics Concern  . Not on file  Social History Narrative  . Not on file   Social Determinants of Health   Financial Resource Strain: Not on file  Food Insecurity: Not on file  Transportation Needs: Not on file  Physical Activity: Not on file  Stress: Not on file  Social Connections: Not on file  Intimate Partner Violence: Not on file    Vitals  Ht 5\' 9"  (1.753 m)   Wt 286 lb (129.7 kg)   BMI 42.23 kg/m    Examination  Gen: Alert and oriented x 3, no acute distress, MORBIDLY OBESE  HEENT: [place]/AT, no scleral icterus, no pale conjunctivae, hearing normal, oral mucosa moist Neck: Supple, no lymphadenopathy Cardio: Regular rate and rhythm; +S1 and S2; no murmurs, gallops, or rubs Resp: CTAB; no wheezes, rhonchi, or rales GI: Soft, nontender, nondistended, bowel sounds present Extremities: No cyanosis, clubbing, or edema; +2 PT and DP pulses Skin: No rashes, lesions, or ecchymoses, TATTOOS.  Neuro: No focal deficits Psych: Calm, cooperative  Lab Results HIV 1 RNA Quant  Date Value  04/26/2020 <20 NOT DETECTED copies/mL  02/22/2020 <20 DETECTED copies/mL  01/11/2020 <20 Copies/mL (H)   CD4 T Cell Abs (/uL)  Date Value  04/26/2020 533  11/19/2019 249 (L)   No results found for: HIV1GENOSEQ Lab Results  Component Value Date   WBC 5.6 04/26/2020   HGB 13.6 04/26/2020   HCT 42.0 04/26/2020   MCV 80.0 04/26/2020   PLT 299 04/26/2020    Lab Results   Component Value Date   CREATININE 1.04 04/26/2020   BUN 9 04/26/2020   NA 140 04/26/2020   K 4.1 04/26/2020   CL 102 04/26/2020   CO2 29 04/26/2020   Lab Results  Component Value Date   ALT 20 04/26/2020   AST 16 04/26/2020   BILITOT 0.5 04/26/2020    Lab Results  Component Value Date   CHOL 204 (H) 02/22/2020   TRIG 98 02/22/2020   HDL 36 (L) 02/22/2020   LDLCALC 147 (H) 02/22/2020   Lab Results  Component Value Date   HAV REACTIVE (A) 11/19/2019   Lab Results  Component Value Date   HEPBSAG NON-REACTIVE 11/19/2019   HEPBSAB NON-REACTIVE 11/19/2019   No results found for: HCVAB Lab Results  Component Value Date   CHLAMYDIAWP Negative 04/26/2020   N Negative 04/26/2020   No results found for: GCPROBEAPT No results found for: QUANTGOLD   Health Maintenance: Immunization History  Administered Date(s) Administered  . DTaP, 5 pertussis antigens 12/08/1998, 02/07/1999, 04/06/1999, 01/26/2000  . HPV 9-valent 12/01/2014  . HPV Quadrivalent 11/20/2012, 10/26/2013  . Hepatitis A, Ped/Adol-2 Dose 11/20/2012, 10/26/2013  . Hepatitis B, ped/adol Jul 26, 1998, 11/14/1998, 04/06/1999  . Hepb-cpg 02/22/2020, 04/26/2020  . HiB (PRP-OMP) 12/08/1998, 02/07/1999, 04/06/1999,  10/16/1999  . IPV 12/08/1998, 02/07/1999, 10/16/1999, 10/08/2002  . Influenza,inj,Quad PF,6+ Mos 03/06/2016, 01/11/2020  . Influenza-Unspecified 01/11/2020  . MMRV 10/16/1999, 10/08/2002  . Meningococcal Conjugate 12/01/2014  . Meningococcal Polysaccharide 11/20/2012  . Moderna Sars-Covid-2 Vaccination 10/25/2019, 11/23/2019  . Pneumococcal-Unspecified 04/06/1999, 07/21/1999, 10/16/1999, 01/26/2000  . Tdap 11/20/2010    Assessment/Plan: Problem List Items Addressed This Visit      Other   HIV disease (HCC) - Primary   Relevant Medications   bictegravir-emtricitabine-tenofovir AF (BIKTARVY) 50-200-25 MG TABS tablet   Other Relevant Orders   HIV-1 RNA ultraquant reflex to gentyp+   RPR   T-helper  cell (CD4)- ([facility] clinic only)   Obesity    Other Visit Diagnoses    Screening for STDs (sexually transmitted diseases)       Immunization counseling         HIV Continue Biktarvy 1 tab po daily Fu in 3-4 months, can do video visit  Obesity/Hyperlipidemia He has lost around 16 lbs and motivated to loose more with healthy diet and exercise Counseled  STD Screening  Refused   Immunization Discussed about getting COVID booster  Health Maintenance  Have discussed about dental health   I spent greater than 35  minutes with the patient including greater than 50% of time in face to face counsel of the patient and in coordination of their care.   Patient's labs were reviewed as well as his previous records. Patients questions were addressed and answered. Safe sex counseling done.   Odette Fraction, MD Infectious Diseases  Office phone [phone] Fax no. [phone]

## 2020-08-11 ENCOUNTER — Other Ambulatory Visit (HOSPITAL_COMMUNITY): Payer: Self-pay

## 2020-08-11 LAB — T-HELPER CELL (CD4) - (RCID CLINIC ONLY)
CD4 % Helper T Cell: 35 % (ref 33–65)
CD4 T Cell Abs: 405 /uL (ref 400–1790)

## 2020-08-12 LAB — URINE CYTOLOGY ANCILLARY ONLY
Chlamydia: NEGATIVE
Comment: NEGATIVE
Comment: NORMAL
Neisseria Gonorrhea: NEGATIVE

## 2020-08-19 ENCOUNTER — Other Ambulatory Visit (HOSPITAL_COMMUNITY): Payer: Self-pay

## 2020-08-22 ENCOUNTER — Other Ambulatory Visit (HOSPITAL_COMMUNITY): Payer: Self-pay

## 2020-08-31 LAB — RPR: RPR Ser Ql: NONREACTIVE

## 2020-08-31 LAB — HIV-1 RNA ULTRAQUANT REFLEX TO GENTYP+
HIV 1 RNA Quant: <20 copies/mL — AB
HIV-1 RNA Quant, Log: <1.3 Log copies/mL — AB

## 2020-09-21 ENCOUNTER — Other Ambulatory Visit (HOSPITAL_COMMUNITY): Payer: Self-pay

## 2020-11-08 ENCOUNTER — Other Ambulatory Visit (HOSPITAL_COMMUNITY): Payer: Self-pay

## 2020-11-11 ENCOUNTER — Other Ambulatory Visit (HOSPITAL_COMMUNITY): Payer: Self-pay

## 2020-11-14 ENCOUNTER — Telehealth: Payer: Medicaid Other | Admitting: Infectious Diseases

## 2020-11-21 ENCOUNTER — Other Ambulatory Visit (HOSPITAL_COMMUNITY): Payer: Self-pay

## 2020-11-28 ENCOUNTER — Other Ambulatory Visit (HOSPITAL_COMMUNITY): Payer: Self-pay

## 2020-12-12 ENCOUNTER — Other Ambulatory Visit: Payer: Self-pay

## 2020-12-12 ENCOUNTER — Telehealth: Payer: Medicaid Other | Admitting: Infectious Diseases

## 2020-12-19 ENCOUNTER — Other Ambulatory Visit (HOSPITAL_COMMUNITY): Payer: Self-pay

## 2020-12-21 ENCOUNTER — Other Ambulatory Visit (HOSPITAL_COMMUNITY): Payer: Self-pay

## 2020-12-26 ENCOUNTER — Other Ambulatory Visit (HOSPITAL_COMMUNITY): Payer: Self-pay

## 2020-12-28 ENCOUNTER — Other Ambulatory Visit (HOSPITAL_COMMUNITY): Payer: Self-pay

## 2020-12-29 ENCOUNTER — Other Ambulatory Visit (HOSPITAL_COMMUNITY): Payer: Self-pay

## 2020-12-30 ENCOUNTER — Other Ambulatory Visit (HOSPITAL_COMMUNITY): Payer: Self-pay

## 2021-01-20 ENCOUNTER — Other Ambulatory Visit (HOSPITAL_COMMUNITY): Payer: Self-pay

## 2021-01-24 ENCOUNTER — Telehealth: Payer: Medicaid Other | Admitting: Infectious Diseases

## 2021-01-24 ENCOUNTER — Other Ambulatory Visit: Payer: Self-pay

## 2021-02-21 ENCOUNTER — Other Ambulatory Visit (HOSPITAL_COMMUNITY): Payer: Self-pay

## 2021-02-24 ENCOUNTER — Other Ambulatory Visit (HOSPITAL_COMMUNITY): Payer: Self-pay

## 2021-02-27 ENCOUNTER — Other Ambulatory Visit (HOSPITAL_COMMUNITY): Payer: Self-pay

## 2021-03-20 ENCOUNTER — Other Ambulatory Visit (HOSPITAL_COMMUNITY): Payer: Self-pay

## 2021-05-08 ENCOUNTER — Other Ambulatory Visit (HOSPITAL_COMMUNITY): Payer: Self-pay

## 2021-05-11 ENCOUNTER — Other Ambulatory Visit (HOSPITAL_COMMUNITY): Payer: Self-pay

## 2021-05-22 ENCOUNTER — Other Ambulatory Visit (HOSPITAL_COMMUNITY): Payer: Self-pay

## 2021-06-26 ENCOUNTER — Other Ambulatory Visit (HOSPITAL_COMMUNITY): Payer: Self-pay

## 2021-08-09 ENCOUNTER — Other Ambulatory Visit (HOSPITAL_COMMUNITY): Payer: Self-pay

## 2021-08-14 ENCOUNTER — Other Ambulatory Visit (HOSPITAL_COMMUNITY): Payer: Self-pay

## 2021-08-16 ENCOUNTER — Other Ambulatory Visit (HOSPITAL_COMMUNITY): Payer: Self-pay

## 2021-08-24 ENCOUNTER — Other Ambulatory Visit (HOSPITAL_COMMUNITY): Payer: Self-pay

## 2021-08-24 ENCOUNTER — Other Ambulatory Visit: Payer: Self-pay | Admitting: Infectious Diseases

## 2021-08-24 ENCOUNTER — Telehealth: Payer: Self-pay

## 2021-08-24 DIAGNOSIS — B20 Human immunodeficiency virus [HIV] disease: Secondary | ICD-10-CM

## 2021-08-24 MED ORDER — BIKTARVY 50-200-25 MG PO TABS
1.0000 | ORAL_TABLET | Freq: Every day | ORAL | 0 refills | Status: DC
  Filled 2021-08-24: qty 30, 30d supply, fill #0

## 2021-08-24 NOTE — Telephone Encounter (Signed)
Called patient to schedule overdue appointment, no answer and voicemail box not set up.   Dinesh Ulysse D Essa Malachi, RN  

## 2021-09-14 ENCOUNTER — Other Ambulatory Visit (HOSPITAL_COMMUNITY): Payer: Self-pay

## 2021-09-18 ENCOUNTER — Other Ambulatory Visit (HOSPITAL_COMMUNITY): Payer: Self-pay

## 2021-09-21 ENCOUNTER — Other Ambulatory Visit (HOSPITAL_COMMUNITY): Payer: Self-pay

## 2021-09-23 ENCOUNTER — Other Ambulatory Visit: Payer: Self-pay | Admitting: Infectious Diseases

## 2021-09-23 ENCOUNTER — Other Ambulatory Visit (HOSPITAL_COMMUNITY): Payer: Self-pay

## 2021-09-23 DIAGNOSIS — B20 Human immunodeficiency virus [HIV] disease: Secondary | ICD-10-CM

## 2021-09-25 ENCOUNTER — Other Ambulatory Visit (HOSPITAL_COMMUNITY): Payer: Self-pay

## 2021-09-25 ENCOUNTER — Other Ambulatory Visit: Payer: Self-pay

## 2021-09-25 ENCOUNTER — Telehealth: Payer: Self-pay

## 2021-09-25 MED ORDER — BIKTARVY 50-200-25 MG PO TABS
1.0000 | ORAL_TABLET | Freq: Every day | ORAL | 0 refills | Status: DC
  Filled 2021-09-25: qty 30, 30d supply, fill #0

## 2021-09-26 ENCOUNTER — Other Ambulatory Visit (HOSPITAL_COMMUNITY): Payer: Self-pay

## 2021-10-04 ENCOUNTER — Other Ambulatory Visit (HOSPITAL_COMMUNITY): Payer: Self-pay

## 2021-10-12 ENCOUNTER — Other Ambulatory Visit (HOSPITAL_COMMUNITY): Payer: Self-pay

## 2021-10-18 ENCOUNTER — Other Ambulatory Visit (HOSPITAL_COMMUNITY): Payer: Self-pay

## 2021-10-23 ENCOUNTER — Other Ambulatory Visit: Payer: Self-pay

## 2021-10-23 DIAGNOSIS — Z79899 Other long term (current) drug therapy: Secondary | ICD-10-CM

## 2021-10-23 DIAGNOSIS — Z113 Encounter for screening for infections with a predominantly sexual mode of transmission: Secondary | ICD-10-CM

## 2021-10-23 DIAGNOSIS — B20 Human immunodeficiency virus [HIV] disease: Secondary | ICD-10-CM

## 2021-10-24 ENCOUNTER — Other Ambulatory Visit (HOSPITAL_COMMUNITY): Payer: Self-pay

## 2021-10-24 ENCOUNTER — Other Ambulatory Visit: Payer: Self-pay | Admitting: Infectious Diseases

## 2021-10-24 DIAGNOSIS — B20 Human immunodeficiency virus [HIV] disease: Secondary | ICD-10-CM

## 2021-10-24 MED ORDER — BIKTARVY 50-200-25 MG PO TABS
1.0000 | ORAL_TABLET | Freq: Every day | ORAL | 0 refills | Status: DC
  Filled 2021-10-24: qty 30, 30d supply, fill #0

## 2021-10-25 ENCOUNTER — Other Ambulatory Visit (HOSPITAL_COMMUNITY): Payer: Self-pay

## 2021-10-26 ENCOUNTER — Other Ambulatory Visit (HOSPITAL_COMMUNITY)
Admission: RE | Admit: 2021-10-26 | Discharge: 2021-10-26 | Disposition: A | Discharge status: Home / Self Care | Payer: Medicaid Other | Source: Ambulatory Visit | Attending: Infectious Diseases | Admitting: Infectious Diseases

## 2021-10-26 ENCOUNTER — Other Ambulatory Visit: Payer: Self-pay

## 2021-10-26 ENCOUNTER — Other Ambulatory Visit: Payer: Medicaid Other

## 2021-10-26 DIAGNOSIS — B20 Human immunodeficiency virus [HIV] disease: Secondary | ICD-10-CM | POA: Diagnosis not present

## 2021-10-26 DIAGNOSIS — Z113 Encounter for screening for infections with a predominantly sexual mode of transmission: Secondary | ICD-10-CM | POA: Insufficient documentation

## 2021-10-26 DIAGNOSIS — Z79899 Other long term (current) drug therapy: Secondary | ICD-10-CM

## 2021-10-27 LAB — URINE CYTOLOGY ANCILLARY ONLY
Chlamydia: NEGATIVE
Comment: NEGATIVE
Comment: NORMAL
Neisseria Gonorrhea: NEGATIVE

## 2021-10-27 LAB — T-HELPER CELL (CD4) - (RCID CLINIC ONLY)
CD4 % Helper T Cell: 31 % — ABNORMAL LOW (ref 33–65)
CD4 T Cell Abs: 462 /uL (ref 400–1790)

## 2021-10-28 LAB — COMPLETE METABOLIC PANEL WITH GFR
AG Ratio: 1.5 (calc) (ref 1.0–2.5)
ALT: 25 U/L (ref 9–46)
AST: 16 U/L (ref 10–40)
Albumin: 4.8 g/dL (ref 3.6–5.1)
Alkaline phosphatase (APISO): 51 U/L (ref 36–130)
BUN: 11 mg/dL (ref 7–25)
CO2: 26 mmol/L (ref 20–32)
Calcium: 9.9 mg/dL (ref 8.6–10.3)
Chloride: 103 mmol/L (ref 98–110)
Creat: 1.19 mg/dL (ref 0.60–1.24)
Globulin: 3.1 g/dL (calc) (ref 1.9–3.7)
Glucose, Bld: 108 mg/dL — ABNORMAL HIGH (ref 65–99)
Potassium: 4 mmol/L (ref 3.5–5.3)
Sodium: 140 mmol/L (ref 135–146)
Total Bilirubin: 0.4 mg/dL (ref 0.2–1.2)
Total Protein: 7.9 g/dL (ref 6.1–8.1)
eGFR: 88 mL/min/{1.73_m2} (ref >=60)

## 2021-10-28 LAB — CBC WITH DIFFERENTIAL/PLATELET
Absolute Monocytes: 527 cells/uL (ref 200–950)
Basophils Absolute: 37 cells/uL (ref 0–200)
Basophils Relative: 0.6 %
Eosinophils Absolute: 87 cells/uL (ref 15–500)
Eosinophils Relative: 1.4 %
HCT: 44.6 % (ref 38.5–50.0)
Hemoglobin: 14.4 g/dL (ref 13.2–17.1)
Lymphs Abs: 1587 cells/uL (ref 850–3900)
MCH: 26.5 pg — ABNORMAL LOW (ref 27.0–33.0)
MCHC: 32.3 g/dL (ref 32.0–36.0)
MCV: 82.1 fL (ref 80.0–100.0)
MPV: 9.9 fL (ref 7.5–12.5)
Monocytes Relative: 8.5 %
Neutro Abs: 3962 cells/uL (ref 1500–7800)
Neutrophils Relative %: 63.9 %
Platelets: 276 10*3/uL (ref 140–400)
RBC: 5.43 10*6/uL (ref 4.20–5.80)
RDW: 12.6 % (ref 11.0–15.0)
Total Lymphocyte: 25.6 %
WBC: 6.2 10*3/uL (ref 3.8–10.8)

## 2021-10-28 LAB — LIPID PANEL
Cholesterol: 220 mg/dL — ABNORMAL HIGH (ref <200)
HDL: 41 mg/dL (ref >=40)
LDL Cholesterol (Calc): 161 mg/dL (calc) — ABNORMAL HIGH
Non-HDL Cholesterol (Calc): 179 mg/dL (calc) — ABNORMAL HIGH (ref <130)
Total CHOL/HDL Ratio: 5.4 (calc) — ABNORMAL HIGH (ref <5.0)
Triglycerides: 80 mg/dL (ref <150)

## 2021-10-28 LAB — RPR: RPR Ser Ql: NONREACTIVE

## 2021-10-28 LAB — HIV-1 RNA QUANT-NO REFLEX-BLD
HIV 1 RNA Quant: NOT DETECTED Copies/mL
HIV-1 RNA Quant, Log: NOT DETECTED Log cps/mL

## 2021-11-13 ENCOUNTER — Other Ambulatory Visit (HOSPITAL_COMMUNITY): Payer: Self-pay

## 2021-11-15 ENCOUNTER — Other Ambulatory Visit: Payer: Self-pay

## 2021-11-15 ENCOUNTER — Ambulatory Visit (INDEPENDENT_AMBULATORY_CARE_PROVIDER_SITE_OTHER): Payer: Medicaid Other | Admitting: Infectious Diseases

## 2021-11-15 ENCOUNTER — Other Ambulatory Visit (HOSPITAL_COMMUNITY): Payer: Self-pay

## 2021-11-15 DIAGNOSIS — E785 Hyperlipidemia, unspecified: Secondary | ICD-10-CM

## 2021-11-15 DIAGNOSIS — B20 Human immunodeficiency virus [HIV] disease: Secondary | ICD-10-CM

## 2021-11-15 DIAGNOSIS — Z5181 Encounter for therapeutic drug level monitoring: Secondary | ICD-10-CM | POA: Diagnosis present

## 2021-11-15 MED ORDER — BIKTARVY 50-200-25 MG PO TABS
1.0000 | ORAL_TABLET | Freq: Every day | ORAL | 5 refills | Status: DC
  Filled 2021-11-15 – 2021-11-20 (×2): qty 30, 30d supply, fill #0
  Filled 2021-12-20: qty 30, 30d supply, fill #1
  Filled 2022-01-19: qty 30, 30d supply, fill #2
  Filled 2022-02-20: qty 30, 30d supply, fill #3
  Filled 2022-03-13 – 2022-03-14 (×2): qty 30, 30d supply, fill #4
  Filled 2022-04-17: qty 30, 30d supply, fill #5

## 2021-11-15 NOTE — Progress Notes (Signed)
Virtual Visit via Telephone Note  I connected withNAME@ on 11/15/21 at  2:45 PM EDT by a telephone enabled telemedicine application and verified that I am speaking with the correct person using two identifiers.  Location: Patient: Home Provider: [facility]   I discussed the limitations of evaluation and management by telemedicine and the availability of in person appointments. The patient expressed understanding and agreed to proceed.  [facility]  Patient Active Problem List   Diagnosis Date Noted   HIV disease (HCC) 02/22/2020   Obesity 02/22/2020   Immunization due 02/22/2020    Patient's Medications  New Prescriptions   No medications on file  Previous Medications   BICTEGRAVIR-EMTRICITABINE-TENOFOVIR AF (BIKTARVY) 50-200-25 MG TABS TABLET    Take 1 tablet by mouth daily.  Modified Medications   No medications on file  Discontinued Medications   No medications on file    History of Present Illness: Here for phone visit for HIV. Discussed lab results 7/27 including high lipid levels.  Takes Biktarvy every day. Denies missing doses, needs refills. Currently in [place], he will be back to [place] in  september. He is trying to eat healthy but states that eating healthy is very expensive. Tells me he has been brought up in a way where most food he eats are unhealthy. He is trying to start exercising again. Discussed to fu with PCP for Hyperlipidemia.   Denies smoking, alcohol occasionally. IVDU  Asking about if he can get tattoo,I told him he might be able to get it but would need to do it in a proper place who can take necessary precautions to avoid HIV transmission to the person tattooing as it would involve contact with blood. No complaints otherwise.   ROS all systems reviewed with pertinent positives and negatives as listed above  Past Medical History:  Diagnosis Date   Asthma    No past surgical history on file.  Social History   Tobacco Use    Smoking status: Never   Smokeless tobacco: Never  Vaping Use   Vaping Use: Never used  Substance Use Topics   Alcohol use: No   Drug use: Never    Family History  Problem Relation Age of Onset   Heart failure Other     Allergies  Allergen Reactions   Penicillins Itching and Swelling    Health Maintenance  Topic Date Due   COVID-19 Vaccine (3 - Moderna risk series) 12/21/2019   TETANUS/TDAP  11/19/2020   INFLUENZA VACCINE  10/31/2021   HPV VACCINES  Completed   Hepatitis C Screening  Completed   HIV Screening  Completed    Observations/Objective:   Assessment and Plan: # HIV  - Continue Biktarvy as is, medications refilled - Fu in 3-4 months   # Hyperlipidemia  - Discussed healthy life style and diet - He will follow up with PCP  # Medication Monitoring - Encouraged compliance   Follow Up Instructions: 3-4 months   I discussed the assessment and treatment plan with the patient. The patient was provided an opportunity to ask questions and all were answered. The patient agreed with the plan and demonstrated an understanding of the instructions.   The patient was advised to call back or seek an in-person evaluation if the symptoms worsen or if the condition fails to improve as anticipated.  I provided 30 minutes of non-face-to-face time during this encounter.  Victoriano Lain, MD [facility] Phone [phone] Fax no.  [phone]  11/15/2021, 2:37 PM

## 2021-11-16 ENCOUNTER — Other Ambulatory Visit (HOSPITAL_COMMUNITY): Payer: Self-pay

## 2021-11-16 DIAGNOSIS — E785 Hyperlipidemia, unspecified: Secondary | ICD-10-CM | POA: Insufficient documentation

## 2021-11-16 DIAGNOSIS — Z5181 Encounter for therapeutic drug level monitoring: Secondary | ICD-10-CM | POA: Insufficient documentation

## 2021-11-20 ENCOUNTER — Other Ambulatory Visit (HOSPITAL_COMMUNITY): Payer: Self-pay

## 2021-12-12 ENCOUNTER — Other Ambulatory Visit (HOSPITAL_COMMUNITY): Payer: Self-pay

## 2021-12-14 ENCOUNTER — Other Ambulatory Visit (HOSPITAL_COMMUNITY): Payer: Self-pay

## 2021-12-18 ENCOUNTER — Other Ambulatory Visit (HOSPITAL_COMMUNITY): Payer: Self-pay

## 2021-12-20 ENCOUNTER — Other Ambulatory Visit (HOSPITAL_COMMUNITY): Payer: Self-pay

## 2022-01-04 ENCOUNTER — Other Ambulatory Visit (HOSPITAL_COMMUNITY): Payer: Self-pay

## 2022-01-11 ENCOUNTER — Other Ambulatory Visit (HOSPITAL_COMMUNITY): Payer: Self-pay

## 2022-01-15 ENCOUNTER — Other Ambulatory Visit (HOSPITAL_COMMUNITY): Payer: Self-pay

## 2022-01-19 ENCOUNTER — Other Ambulatory Visit (HOSPITAL_COMMUNITY): Payer: Self-pay

## 2022-02-13 ENCOUNTER — Other Ambulatory Visit (HOSPITAL_COMMUNITY): Payer: Self-pay

## 2022-02-20 ENCOUNTER — Other Ambulatory Visit (HOSPITAL_COMMUNITY): Payer: Self-pay

## 2022-03-07 ENCOUNTER — Ambulatory Visit: Payer: Medicaid Other | Admitting: Infectious Diseases

## 2022-03-08 ENCOUNTER — Other Ambulatory Visit (HOSPITAL_COMMUNITY): Payer: Self-pay

## 2022-03-12 ENCOUNTER — Other Ambulatory Visit: Payer: Self-pay

## 2022-03-13 ENCOUNTER — Other Ambulatory Visit (HOSPITAL_COMMUNITY): Payer: Self-pay

## 2022-03-14 ENCOUNTER — Other Ambulatory Visit (HOSPITAL_COMMUNITY): Payer: Self-pay

## 2022-03-14 ENCOUNTER — Other Ambulatory Visit: Payer: Self-pay

## 2022-03-15 ENCOUNTER — Other Ambulatory Visit: Payer: Self-pay

## 2022-04-03 ENCOUNTER — Other Ambulatory Visit (HOSPITAL_COMMUNITY): Payer: Self-pay

## 2022-04-05 ENCOUNTER — Other Ambulatory Visit (HOSPITAL_COMMUNITY): Payer: Self-pay

## 2022-04-09 ENCOUNTER — Other Ambulatory Visit (HOSPITAL_COMMUNITY): Payer: Self-pay

## 2022-04-10 ENCOUNTER — Other Ambulatory Visit (HOSPITAL_COMMUNITY): Payer: Self-pay

## 2022-04-17 ENCOUNTER — Other Ambulatory Visit (HOSPITAL_COMMUNITY): Payer: Self-pay

## 2022-04-18 ENCOUNTER — Other Ambulatory Visit (HOSPITAL_COMMUNITY): Payer: Self-pay

## 2022-05-08 ENCOUNTER — Other Ambulatory Visit (HOSPITAL_COMMUNITY): Payer: Self-pay

## 2022-05-08 ENCOUNTER — Other Ambulatory Visit: Payer: Self-pay | Admitting: Infectious Diseases

## 2022-05-08 DIAGNOSIS — B20 Human immunodeficiency virus [HIV] disease: Secondary | ICD-10-CM

## 2022-05-08 MED ORDER — BIKTARVY 50-200-25 MG PO TABS
1.0000 | ORAL_TABLET | Freq: Every day | ORAL | 2 refills | Status: DC
  Filled 2022-05-08 – 2022-05-10 (×2): qty 30, 30d supply, fill #0
  Filled 2022-06-18: qty 30, 30d supply, fill #1
  Filled 2022-07-16: qty 30, 30d supply, fill #2

## 2022-05-10 ENCOUNTER — Other Ambulatory Visit (HOSPITAL_COMMUNITY): Payer: Self-pay

## 2022-05-16 ENCOUNTER — Other Ambulatory Visit: Payer: Self-pay

## 2022-06-12 ENCOUNTER — Other Ambulatory Visit (HOSPITAL_COMMUNITY): Payer: Self-pay

## 2022-06-18 ENCOUNTER — Other Ambulatory Visit: Payer: Self-pay

## 2022-06-18 ENCOUNTER — Other Ambulatory Visit (HOSPITAL_COMMUNITY): Payer: Self-pay

## 2022-07-10 ENCOUNTER — Other Ambulatory Visit (HOSPITAL_COMMUNITY): Payer: Self-pay

## 2022-07-16 ENCOUNTER — Other Ambulatory Visit (HOSPITAL_COMMUNITY): Payer: Self-pay

## 2022-07-17 ENCOUNTER — Other Ambulatory Visit: Payer: Self-pay

## 2022-07-24 ENCOUNTER — Ambulatory Visit: Payer: Medicaid Other | Admitting: Infectious Diseases

## 2022-07-27 ENCOUNTER — Other Ambulatory Visit: Payer: Self-pay

## 2022-07-27 DIAGNOSIS — Z79899 Other long term (current) drug therapy: Secondary | ICD-10-CM

## 2022-07-27 DIAGNOSIS — B20 Human immunodeficiency virus [HIV] disease: Secondary | ICD-10-CM

## 2022-07-27 DIAGNOSIS — Z113 Encounter for screening for infections with a predominantly sexual mode of transmission: Secondary | ICD-10-CM

## 2022-07-30 ENCOUNTER — Other Ambulatory Visit: Payer: Medicaid Other

## 2022-08-09 ENCOUNTER — Other Ambulatory Visit (HOSPITAL_COMMUNITY): Payer: Self-pay

## 2022-08-14 ENCOUNTER — Other Ambulatory Visit (HOSPITAL_COMMUNITY): Payer: Self-pay

## 2022-08-16 ENCOUNTER — Other Ambulatory Visit: Payer: Self-pay

## 2022-08-16 ENCOUNTER — Other Ambulatory Visit (HOSPITAL_COMMUNITY): Payer: Self-pay

## 2022-08-17 ENCOUNTER — Other Ambulatory Visit: Payer: Self-pay

## 2022-08-17 ENCOUNTER — Other Ambulatory Visit: Payer: Self-pay | Admitting: Infectious Diseases

## 2022-08-17 ENCOUNTER — Other Ambulatory Visit (HOSPITAL_COMMUNITY): Payer: Self-pay

## 2022-08-17 DIAGNOSIS — B20 Human immunodeficiency virus [HIV] disease: Secondary | ICD-10-CM

## 2022-08-20 ENCOUNTER — Other Ambulatory Visit: Payer: Self-pay

## 2022-08-20 ENCOUNTER — Other Ambulatory Visit: Payer: Self-pay | Admitting: Infectious Diseases

## 2022-08-20 ENCOUNTER — Other Ambulatory Visit (HOSPITAL_COMMUNITY): Payer: Self-pay

## 2022-08-20 DIAGNOSIS — B20 Human immunodeficiency virus [HIV] disease: Secondary | ICD-10-CM

## 2022-08-20 MED ORDER — BIKTARVY 50-200-25 MG PO TABS
1.0000 | ORAL_TABLET | Freq: Every day | ORAL | 3 refills | Status: DC
Start: 2022-08-20 — End: 2022-12-18
  Filled 2022-08-20: qty 30, 30d supply, fill #0
  Filled 2022-09-17: qty 30, 30d supply, fill #1
  Filled 2022-10-16: qty 30, 30d supply, fill #2
  Filled 2022-11-07: qty 30, 30d supply, fill #3

## 2022-09-05 ENCOUNTER — Other Ambulatory Visit (HOSPITAL_COMMUNITY): Payer: Self-pay

## 2022-09-06 ENCOUNTER — Other Ambulatory Visit (HOSPITAL_COMMUNITY): Payer: Self-pay

## 2022-09-10 ENCOUNTER — Other Ambulatory Visit (HOSPITAL_COMMUNITY): Payer: Self-pay

## 2022-09-12 ENCOUNTER — Other Ambulatory Visit (HOSPITAL_COMMUNITY): Payer: Self-pay

## 2022-09-17 ENCOUNTER — Encounter: Payer: Self-pay | Admitting: Pharmacy Technician

## 2022-09-17 ENCOUNTER — Other Ambulatory Visit (HOSPITAL_COMMUNITY): Payer: Self-pay

## 2022-09-17 ENCOUNTER — Other Ambulatory Visit: Payer: Self-pay

## 2022-10-08 ENCOUNTER — Other Ambulatory Visit (HOSPITAL_COMMUNITY): Payer: Self-pay

## 2022-10-15 ENCOUNTER — Other Ambulatory Visit (HOSPITAL_COMMUNITY): Payer: Self-pay

## 2022-10-15 ENCOUNTER — Encounter (HOSPITAL_COMMUNITY): Payer: Self-pay

## 2022-10-16 ENCOUNTER — Other Ambulatory Visit (HOSPITAL_COMMUNITY): Payer: Self-pay

## 2022-10-16 ENCOUNTER — Other Ambulatory Visit: Payer: Self-pay

## 2022-11-07 ENCOUNTER — Other Ambulatory Visit (HOSPITAL_COMMUNITY): Payer: Self-pay

## 2022-11-12 ENCOUNTER — Other Ambulatory Visit (HOSPITAL_COMMUNITY): Payer: Self-pay

## 2022-11-13 ENCOUNTER — Ambulatory Visit: Payer: Medicaid Other | Admitting: Infectious Diseases

## 2022-12-10 ENCOUNTER — Encounter (HOSPITAL_COMMUNITY): Payer: Self-pay

## 2022-12-10 ENCOUNTER — Other Ambulatory Visit (HOSPITAL_COMMUNITY): Payer: Self-pay

## 2022-12-12 ENCOUNTER — Other Ambulatory Visit (HOSPITAL_COMMUNITY): Payer: Self-pay

## 2022-12-12 ENCOUNTER — Encounter (HOSPITAL_COMMUNITY): Payer: Self-pay

## 2022-12-18 ENCOUNTER — Other Ambulatory Visit: Payer: Self-pay | Admitting: Infectious Diseases

## 2022-12-18 ENCOUNTER — Other Ambulatory Visit (HOSPITAL_COMMUNITY): Payer: Self-pay

## 2022-12-18 DIAGNOSIS — B20 Human immunodeficiency virus [HIV] disease: Secondary | ICD-10-CM

## 2022-12-18 NOTE — Telephone Encounter (Signed)
Front desk working on scheduling pt

## 2022-12-19 ENCOUNTER — Other Ambulatory Visit (HOSPITAL_COMMUNITY): Payer: Self-pay

## 2022-12-19 ENCOUNTER — Other Ambulatory Visit: Payer: Self-pay

## 2022-12-19 MED ORDER — BIKTARVY 50-200-25 MG PO TABS
1.0000 | ORAL_TABLET | Freq: Every day | ORAL | 0 refills | Status: DC
Start: 2022-12-19 — End: 2023-01-14
  Filled 2022-12-19: qty 30, 30d supply, fill #0

## 2022-12-27 ENCOUNTER — Other Ambulatory Visit: Payer: Medicaid Other

## 2023-01-01 ENCOUNTER — Other Ambulatory Visit: Payer: Self-pay

## 2023-01-01 DIAGNOSIS — Z79899 Other long term (current) drug therapy: Secondary | ICD-10-CM

## 2023-01-01 DIAGNOSIS — B20 Human immunodeficiency virus [HIV] disease: Secondary | ICD-10-CM

## 2023-01-01 DIAGNOSIS — Z113 Encounter for screening for infections with a predominantly sexual mode of transmission: Secondary | ICD-10-CM

## 2023-01-04 ENCOUNTER — Other Ambulatory Visit: Payer: Self-pay

## 2023-01-04 ENCOUNTER — Other Ambulatory Visit: Payer: Medicaid Other

## 2023-01-04 DIAGNOSIS — Z79899 Other long term (current) drug therapy: Secondary | ICD-10-CM

## 2023-01-04 DIAGNOSIS — Z113 Encounter for screening for infections with a predominantly sexual mode of transmission: Secondary | ICD-10-CM

## 2023-01-04 DIAGNOSIS — B20 Human immunodeficiency virus [HIV] disease: Secondary | ICD-10-CM

## 2023-01-05 LAB — C. TRACHOMATIS/N. GONORRHOEAE RNA
C. trachomatis RNA, TMA: NOT DETECTED
N. gonorrhoeae RNA, TMA: NOT DETECTED

## 2023-01-07 LAB — LIPID PANEL
Cholesterol: 209 mg/dL — ABNORMAL HIGH (ref <200)
HDL: 45 mg/dL (ref >=40)
LDL Cholesterol (Calc): 143 mg/dL — ABNORMAL HIGH
Non-HDL Cholesterol (Calc): 164 mg/dL — ABNORMAL HIGH (ref <130)
Total CHOL/HDL Ratio: 4.6 (calc) (ref <5.0)
Triglycerides: 97 mg/dL (ref <150)

## 2023-01-07 LAB — COMPLETE METABOLIC PANEL WITH GFR
AG Ratio: 1.7 (calc) (ref 1.0–2.5)
ALT: 28 U/L (ref 9–46)
AST: 18 U/L (ref 10–40)
Albumin: 4.8 g/dL (ref 3.6–5.1)
Alkaline phosphatase (APISO): 57 U/L (ref 36–130)
BUN: 12 mg/dL (ref 7–25)
CO2: 25 mmol/L (ref 20–32)
Calcium: 9.6 mg/dL (ref 8.6–10.3)
Chloride: 101 mmol/L (ref 98–110)
Creat: 1.07 mg/dL (ref 0.60–1.24)
Globulin: 2.8 g/dL (ref 1.9–3.7)
Glucose, Bld: 125 mg/dL — ABNORMAL HIGH (ref 65–99)
Potassium: 3.9 mmol/L (ref 3.5–5.3)
Sodium: 138 mmol/L (ref 135–146)
Total Bilirubin: 0.6 mg/dL (ref 0.2–1.2)
Total Protein: 7.6 g/dL (ref 6.1–8.1)
eGFR: 99 mL/min/{1.73_m2} (ref >=60)

## 2023-01-07 LAB — T-HELPER CELLS (CD4) COUNT (NOT AT ARMC)
Absolute CD4: 917 {cells}/uL (ref 490–1740)
CD4 T Helper %: 36 % (ref 30–61)
Total lymphocyte count: 2582 {cells}/uL (ref 850–3900)

## 2023-01-07 LAB — CBC WITH DIFFERENTIAL/PLATELET
Absolute Monocytes: 648 {cells}/uL (ref 200–950)
Basophils Absolute: 72 {cells}/uL (ref 0–200)
Basophils Relative: 1 %
Eosinophils Absolute: 216 {cells}/uL (ref 15–500)
Eosinophils Relative: 3 %
HCT: 47 % (ref 38.5–50.0)
Hemoglobin: 14.7 g/dL (ref 13.2–17.1)
Lymphs Abs: 2419 {cells}/uL (ref 850–3900)
MCH: 26.5 pg — ABNORMAL LOW (ref 27.0–33.0)
MCHC: 31.3 g/dL — ABNORMAL LOW (ref 32.0–36.0)
MCV: 84.8 fL (ref 80.0–100.0)
MPV: 9.8 fL (ref 7.5–12.5)
Monocytes Relative: 9 %
Neutro Abs: 3845 {cells}/uL (ref 1500–7800)
Neutrophils Relative %: 53.4 %
Platelets: 212 10*3/uL (ref 140–400)
RBC: 5.54 10*6/uL (ref 4.20–5.80)
RDW: 13.3 % (ref 11.0–15.0)
Total Lymphocyte: 33.6 %
WBC: 7.2 10*3/uL (ref 3.8–10.8)

## 2023-01-07 LAB — HIV-1 RNA QUANT-NO REFLEX-BLD
HIV 1 RNA Quant: NOT DETECTED {copies}/mL
HIV-1 RNA Quant, Log: NOT DETECTED {Log_copies}/mL

## 2023-01-07 LAB — RPR: RPR Ser Ql: NONREACTIVE

## 2023-01-08 ENCOUNTER — Other Ambulatory Visit (HOSPITAL_COMMUNITY): Payer: Self-pay

## 2023-01-11 ENCOUNTER — Other Ambulatory Visit (HOSPITAL_COMMUNITY): Payer: Self-pay

## 2023-01-14 ENCOUNTER — Other Ambulatory Visit: Payer: Self-pay

## 2023-01-14 ENCOUNTER — Other Ambulatory Visit (HOSPITAL_COMMUNITY): Payer: Self-pay | Admitting: Pharmacy Technician

## 2023-01-14 ENCOUNTER — Other Ambulatory Visit (HOSPITAL_COMMUNITY): Payer: Self-pay

## 2023-01-14 ENCOUNTER — Other Ambulatory Visit: Payer: Self-pay | Admitting: Infectious Diseases

## 2023-01-14 DIAGNOSIS — B20 Human immunodeficiency virus [HIV] disease: Secondary | ICD-10-CM

## 2023-01-14 MED ORDER — BIKTARVY 50-200-25 MG PO TABS
1.0000 | ORAL_TABLET | Freq: Every day | ORAL | 0 refills | Status: DC
  Filled 2023-01-14: qty 30, 30d supply, fill #0

## 2023-01-14 NOTE — Progress Notes (Signed)
Specialty Pharmacy Refill Coordination Note  Kent Kramer is a 22 y.o. male contacted today regarding refills of specialty medication(s) Bictegravir-Emtricitab-Tenofov   Patient requested Delivery   Delivery date: 01/18/23   Verified address: [address] [place]   Medication will be filled on 10/16 or 10/17.  Refill Request sent to MD; Call if any delays

## 2023-01-16 ENCOUNTER — Other Ambulatory Visit: Payer: Self-pay

## 2023-01-17 ENCOUNTER — Other Ambulatory Visit: Payer: Self-pay

## 2023-01-29 ENCOUNTER — Ambulatory Visit: Payer: Medicaid Other | Admitting: Infectious Diseases

## 2023-02-05 ENCOUNTER — Other Ambulatory Visit: Payer: Self-pay

## 2023-02-05 ENCOUNTER — Other Ambulatory Visit (HOSPITAL_COMMUNITY): Payer: Self-pay

## 2023-02-05 ENCOUNTER — Other Ambulatory Visit: Payer: Self-pay | Admitting: Infectious Diseases

## 2023-02-05 DIAGNOSIS — B20 Human immunodeficiency virus [HIV] disease: Secondary | ICD-10-CM

## 2023-02-05 MED ORDER — BIKTARVY 50-200-25 MG PO TABS
1.0000 | ORAL_TABLET | Freq: Every day | ORAL | 0 refills | Status: DC
  Filled 2023-02-05: qty 30, 30d supply, fill #0

## 2023-02-05 NOTE — Progress Notes (Signed)
Specialty Pharmacy Ongoing Clinical Assessment Note  Kent Kramer is a 22 y.o. male who is being followed by the specialty pharmacy service for RxSp HIV   Patient's specialty medication(s) reviewed today: Bictegravir-Emtricitab-Tenofov   Missed doses in the last 4 weeks: 0   Patient/Caregiver did not have any additional questions or concerns.   Therapeutic benefit summary: Patient is achieving benefit   Adverse events/side effects summary: No adverse events/side effects   Patient's therapy is appropriate to: Continue    Goals Addressed             This Visit's Progress    Achieve Undetectable HIV Viral Load < 20       Patient is on track. Patient will maintain adherence         Follow up:  6 months  Bobette Mo Specialty Pharmacist

## 2023-02-05 NOTE — Progress Notes (Signed)
Specialty Pharmacy Refill Coordination Note  Assad Harbeson is a 22 y.o. male contacted today regarding refills of specialty medication(s) Bictegravir-Emtricitab-Tenofov   Patient requested Delivery   Delivery date: 02/11/23   Verified address: [address] [place]   Medication will be filled on 02/08/23.     Pending refill request. Patient aware that refill may not be sent until appointment 11/19, but call if delayed.

## 2023-02-07 ENCOUNTER — Other Ambulatory Visit (HOSPITAL_COMMUNITY): Payer: Self-pay

## 2023-02-19 ENCOUNTER — Other Ambulatory Visit: Payer: Self-pay

## 2023-02-19 ENCOUNTER — Other Ambulatory Visit (HOSPITAL_COMMUNITY): Payer: Self-pay

## 2023-02-19 ENCOUNTER — Telehealth: Payer: Medicaid Other | Admitting: Infectious Diseases

## 2023-02-19 DIAGNOSIS — E669 Obesity, unspecified: Secondary | ICD-10-CM

## 2023-02-19 DIAGNOSIS — Z7185 Encounter for immunization safety counseling: Secondary | ICD-10-CM

## 2023-02-19 DIAGNOSIS — B20 Human immunodeficiency virus [HIV] disease: Secondary | ICD-10-CM

## 2023-02-19 DIAGNOSIS — Z5181 Encounter for therapeutic drug level monitoring: Secondary | ICD-10-CM

## 2023-02-19 DIAGNOSIS — E785 Hyperlipidemia, unspecified: Secondary | ICD-10-CM | POA: Diagnosis not present

## 2023-02-19 DIAGNOSIS — Z113 Encounter for screening for infections with a predominantly sexual mode of transmission: Secondary | ICD-10-CM

## 2023-02-19 MED ORDER — BIKTARVY 50-200-25 MG PO TABS
1.0000 | ORAL_TABLET | Freq: Every day | ORAL | 3 refills | Status: DC
  Filled 2023-02-20: qty 90, 90d supply, fill #0
  Filled 2023-03-12: qty 30, 30d supply, fill #0
  Filled 2023-04-15: qty 30, 30d supply, fill #1
  Filled 2023-05-13: qty 30, 30d supply, fill #2
  Filled 2023-06-10: qty 30, 30d supply, fill #3
  Filled 2023-07-12: qty 30, 30d supply, fill #4
  Filled 2023-08-13: qty 30, 30d supply, fill #5
  Filled 2023-09-09: qty 30, 30d supply, fill #6
  Filled 2023-10-01 – 2023-10-11 (×2): qty 30, 30d supply, fill #7
  Filled 2023-11-06 – 2023-11-13 (×3): qty 30, 30d supply, fill #8
  Filled 2023-12-10 – 2023-12-12 (×3): qty 30, 30d supply, fill #9
  Filled 2024-01-07 – 2024-01-09 (×2): qty 30, 30d supply, fill #10
  Filled 2024-02-03 – 2024-02-11 (×2): qty 30, 30d supply, fill #11

## 2023-02-19 NOTE — Progress Notes (Signed)
Virtual Visit via Video Note  I connected withNAME@ on 02/19/23 at  3:45 PM EST by a video enabled telemedicine application and verified that I am speaking with the correct person using two identifiers.  Location: Patient: Home  Provider: [facility]   I discussed the limitations of evaluation and management by telemedicine and the availability of in person appointments. The patient expressed understanding and agreed to proceed.  [facility]  Patient Active Problem List   Diagnosis Date Noted   Medication monitoring encounter 11/16/2021   Hyperlipidemia 11/16/2021   HIV disease (HCC) 02/22/2020   Obesity 02/22/2020   Immunization due 02/22/2020    Patient's Medications  New Prescriptions   No medications on file  Previous Medications   BICTEGRAVIR-EMTRICITABINE-TENOFOVIR AF (BIKTARVY) 50-200-25 MG TABS TABLET    Take 1 tablet by mouth daily.  Modified Medications   No medications on file  Discontinued Medications   No medications on file    History of Present Illness: 22 Y O male who is here for HIV follow up. Reports taking Bijtarvy without missing doses or concerns. He is very excited about being graduated from college and works in Social research officer, government. Discussed about flu and covid vaccine and he said he would get it. He was concerned about his high cholesterol and reports his mother trying to find a dietitian to work on his diet.  Denies being sexually active. Denies mental health concerns. No complaints today  ROS all systems reviewed and negative  Past Medical History:  Diagnosis Date   Asthma     Social History   Tobacco Use   Smoking status: Never   Smokeless tobacco: Never  Vaping Use   Vaping status: Never Used  Substance Use Topics   Alcohol use: No   Drug use: Never    Family History  Problem Relation Age of Onset   Heart failure Other     Allergies  Allergen Reactions   Penicillins Itching and Swelling    Health Maintenance  Topic  Date Due   COVID-19 Vaccine (3 - Moderna risk series) 12/21/2019   DTaP/Tdap/Td (6 - Td or Tdap) 11/19/2020   INFLUENZA VACCINE  11/01/2022   HPV VACCINES  Completed   Hepatitis C Screening  Completed   HIV Screening  Completed    Observations/Objective:   Assessment and Plan: # HIV  - continue biktarvy, meds refilled ( 3 months supply with 3 refills per his preference) - last lab results discussed  - Fu in 5-6 months  # Obesity/HLD - He is considering seeing a dietitian.  - Re-enforced healthy diet and exercise   # STD screening - no acute concerns - urine GC and RPR negative   # Immunization  - discussed about getting Flu and covid vaccine   Follow Up Instructions: 5-6 months    I discussed the assessment and treatment plan with the patient. The patient was provided an opportunity to ask questions and all were answered. The patient agreed with the plan and demonstrated an understanding of the instructions.   The patient was advised to call back or seek an in-person evaluation if the symptoms worsen or if the condition fails to improve as anticipated.  I provided 40 minutes of non-face-to-face time during this encounter.  Of note, portions of this note may have been created with voice recognition software. While this note has been edited for accuracy, occasional wrong-word or 'sound-a-like' substitutions may have occurred due to the inherent limitations of voice recognition software.  Victoriano Lain, MD [facility] [phone] pager   [phone] cell 02/19/2023, 3:37 PM

## 2023-02-20 ENCOUNTER — Other Ambulatory Visit: Payer: Self-pay

## 2023-02-20 ENCOUNTER — Other Ambulatory Visit (HOSPITAL_COMMUNITY): Payer: Self-pay

## 2023-03-06 ENCOUNTER — Other Ambulatory Visit: Payer: Self-pay

## 2023-03-11 ENCOUNTER — Other Ambulatory Visit: Payer: Self-pay

## 2023-03-12 ENCOUNTER — Other Ambulatory Visit: Payer: Self-pay

## 2023-03-12 ENCOUNTER — Other Ambulatory Visit (HOSPITAL_COMMUNITY): Payer: Self-pay

## 2023-03-12 ENCOUNTER — Other Ambulatory Visit (HOSPITAL_COMMUNITY): Payer: Self-pay | Admitting: Pharmacy Technician

## 2023-03-12 NOTE — Progress Notes (Signed)
Specialty Pharmacy Refill Coordination Note  Kent Kramer is a 22 y.o. male contacted today regarding refills of specialty medication(s) Bictegravir-Emtricitab-Tenofov   Patient requested Delivery   Delivery date: 03/15/23   Verified address: [address] [place]   Medication will be filled on 03/14/23.

## 2023-04-08 ENCOUNTER — Other Ambulatory Visit (HOSPITAL_COMMUNITY): Payer: Self-pay

## 2023-04-12 ENCOUNTER — Other Ambulatory Visit (HOSPITAL_COMMUNITY): Payer: Self-pay

## 2023-04-12 ENCOUNTER — Encounter (HOSPITAL_COMMUNITY): Payer: Self-pay

## 2023-04-15 ENCOUNTER — Other Ambulatory Visit: Payer: Self-pay

## 2023-04-15 NOTE — Progress Notes (Signed)
 Specialty Pharmacy Refill Coordination Note  Kent Kramer is a 23 y.o. male contacted today regarding refills of specialty medication(s) Bictegravir-Emtricitab-Tenofov (Biktarvy )   Patient requested Delivery   Delivery date: 04/17/23   Verified address: [address]   Medication will be filled on 04/16/23.

## 2023-04-16 ENCOUNTER — Other Ambulatory Visit: Payer: Self-pay

## 2023-05-03 ENCOUNTER — Other Ambulatory Visit: Payer: Self-pay

## 2023-05-07 ENCOUNTER — Other Ambulatory Visit (HOSPITAL_COMMUNITY): Payer: Self-pay

## 2023-05-13 ENCOUNTER — Other Ambulatory Visit (HOSPITAL_COMMUNITY): Payer: Self-pay | Admitting: Pharmacy Technician

## 2023-05-13 ENCOUNTER — Other Ambulatory Visit (HOSPITAL_COMMUNITY): Payer: Self-pay

## 2023-05-13 NOTE — Progress Notes (Signed)
 Specialty Pharmacy Refill Coordination Note  Kent Kramer is a 23 y.o. male contacted today regarding refills of specialty medication(s) Bictegravir-Emtricitab-Tenofov (Biktarvy )   Patient requested Delivery   Delivery date: 05/15/23   Verified address: [address] [place]   Medication will be filled on 05/14/23.

## 2023-05-14 ENCOUNTER — Other Ambulatory Visit: Payer: Self-pay

## 2023-06-10 ENCOUNTER — Other Ambulatory Visit: Payer: Self-pay

## 2023-06-10 ENCOUNTER — Other Ambulatory Visit: Payer: Self-pay | Admitting: Pharmacy Technician

## 2023-06-10 NOTE — Progress Notes (Signed)
 Specialty Pharmacy Refill Coordination Note  Kent Kramer is a 23 y.o. male contacted today regarding refills of specialty medication(s) Bictegravir-Emtricitab-Tenofov [name]   Patient requested Delivery   Delivery date: 06/13/23   Verified address: [address]  [name]   Medication will be filled on 06/12/23.

## 2023-07-03 ENCOUNTER — Other Ambulatory Visit (HOSPITAL_COMMUNITY): Payer: Self-pay

## 2023-07-12 ENCOUNTER — Other Ambulatory Visit: Payer: Self-pay

## 2023-07-12 NOTE — Progress Notes (Signed)
 Specialty Pharmacy Refill Coordination Note  Kent Kramer is a 23 y.o. male contacted today regarding refills of specialty medication(s) Bictegravir-Emtricitab-Tenofov [name]   Patient requested Delivery   Delivery date: 07/17/23   Verified address: [address] [place] [address]   Medication will be filled on 07/16/23.

## 2023-08-05 ENCOUNTER — Other Ambulatory Visit (HOSPITAL_COMMUNITY): Payer: Self-pay

## 2023-08-09 ENCOUNTER — Telehealth: Payer: Self-pay | Admitting: Pharmacist

## 2023-08-09 ENCOUNTER — Other Ambulatory Visit: Payer: Self-pay | Admitting: Pharmacist

## 2023-08-09 NOTE — Progress Notes (Signed)
 Specialty Pharmacy Ongoing Clinical Assessment Note  Kent Kramer is a 23 y.o. male who is being followed by the specialty pharmacy service for RxSp HIV   Patient's specialty medication(s) reviewed today: Bictegravir-Emtricitab-Tenofov (Biktarvy )   Missed doses in the last 4 weeks: 0   Patient/Caregiver did not have any additional questions or concerns.   Therapeutic benefit summary: Patient is achieving benefit   Adverse events/side effects summary: No adverse events/side effects   Patient's therapy is appropriate to: Continue    Goals Addressed             This Visit's Progress    Achieve Undetectable HIV Viral Load < 20   On track    Patient is on track. Patient will maintain adherence      Comply with lab assessments   No change    Patient is on track. Patient will adhere to provider and/or lab appointments      Increase CD4 count until steady state   On track    Patient is on track. Patient will maintain adherence         Follow up: 6 months  Sonya Duster Specialty Pharmacist

## 2023-08-09 NOTE — Telephone Encounter (Signed)
 Can you all reach out to him next week for follow up with Dr. Gillian Lacrosse soon? Thanks!  Nicklas Barns, PharmD, CPP, BCIDP, AAHIVP Clinical Pharmacist Practitioner Infectious Diseases Clinical Pharmacist [facility]

## 2023-08-12 ENCOUNTER — Other Ambulatory Visit: Payer: Self-pay

## 2023-08-13 ENCOUNTER — Other Ambulatory Visit: Payer: Self-pay

## 2023-08-13 ENCOUNTER — Other Ambulatory Visit: Payer: Self-pay | Admitting: Pharmacy Technician

## 2023-08-13 NOTE — Progress Notes (Signed)
 Specialty Pharmacy Refill Coordination Note  Kent Kramer is a 23 y.o. male contacted today regarding refills of specialty medication(s) Bictegravir-Emtricitab-Tenofov (Biktarvy )   Patient requested Delivery   Delivery date: 08/15/23   Verified address: [address] [name], [place] [address]   Medication will be filled on 08/14/23.

## 2023-08-14 ENCOUNTER — Telehealth: Payer: Self-pay

## 2023-08-14 NOTE — Telephone Encounter (Signed)
 Patient voice mail has not been set up, needs to scheduled first available appointment with Dr. Gillian Lacrosse.

## 2023-09-09 ENCOUNTER — Other Ambulatory Visit: Payer: Self-pay

## 2023-09-09 NOTE — Progress Notes (Signed)
 Package inadvertently shipped 6/9 for delivery 6/10. Emailed [facility] dispatch to deliver instead on 6/11.

## 2023-09-09 NOTE — Progress Notes (Signed)
 Specialty Pharmacy Refill Coordination Note  Kent Kramer is a 23 y.o. male contacted today regarding refills of specialty medication(s) Bictegravir-Emtricitab-Tenofov (Biktarvy )   Patient requested Delivery   Delivery date: 09/11/23   Verified address: [address] [place], [place] [address]   Medication will be filled on 09/09/23.

## 2023-10-01 ENCOUNTER — Other Ambulatory Visit: Payer: Self-pay

## 2023-10-03 ENCOUNTER — Other Ambulatory Visit: Payer: Self-pay

## 2023-10-11 ENCOUNTER — Other Ambulatory Visit: Payer: Self-pay

## 2023-10-11 ENCOUNTER — Other Ambulatory Visit (HOSPITAL_COMMUNITY): Payer: Self-pay

## 2023-10-11 NOTE — Progress Notes (Signed)
 Specialty Pharmacy Refill Coordination Note  Spoke with Kent Kramer  Kent Kramer is a 23 y.o. male contacted today regarding refills of specialty medication(s) Bictegravir-Emtricitab-Tenofov (Biktarvy )  Patient requested: Delivery   Delivery date: 10/14/23   Verified address: [address]  [place] [address]  Medication will be filled on 10/11/23.

## 2023-11-01 ENCOUNTER — Other Ambulatory Visit (HOSPITAL_COMMUNITY): Payer: Self-pay

## 2023-11-04 ENCOUNTER — Other Ambulatory Visit: Payer: Self-pay

## 2023-11-06 ENCOUNTER — Other Ambulatory Visit: Payer: Self-pay

## 2023-11-08 ENCOUNTER — Other Ambulatory Visit: Payer: Self-pay

## 2023-11-13 ENCOUNTER — Other Ambulatory Visit: Payer: Self-pay

## 2023-11-13 NOTE — Progress Notes (Signed)
 Specialty Pharmacy Refill Coordination Note  Kent Kramer is a 23 y.o. male contacted today regarding refills of specialty medication(s) Bictegravir-Emtricitab-Tenofov (Biktarvy )   Patient requested Delivery   Delivery date: 11/14/23   Verified address: [address], [place] [address]   Medication will be filled on 11/13/23.

## 2023-12-06 ENCOUNTER — Other Ambulatory Visit (HOSPITAL_COMMUNITY): Payer: Self-pay

## 2023-12-10 ENCOUNTER — Other Ambulatory Visit: Payer: Self-pay

## 2023-12-12 ENCOUNTER — Other Ambulatory Visit: Payer: Self-pay

## 2023-12-12 NOTE — Progress Notes (Signed)
 Specialty Pharmacy Refill Coordination Note  Kent Kramer is a 23 y.o. male contacted today regarding refills of specialty medication(s) Bictegravir-Emtricitab-Tenofov (Biktarvy )   Patient requested Delivery   Delivery date: 12/13/23   Verified address: [address], [place] [address]   Medication will be filled on 12/12/23.

## 2024-01-02 ENCOUNTER — Other Ambulatory Visit: Payer: Self-pay

## 2024-01-07 ENCOUNTER — Other Ambulatory Visit: Payer: Self-pay

## 2024-01-09 ENCOUNTER — Other Ambulatory Visit: Payer: Self-pay

## 2024-01-09 NOTE — Progress Notes (Signed)
 Specialty Pharmacy Refill Coordination Note  Kent Kramer is a 23 y.o. male contacted today regarding refills of specialty medication(s) Bictegravir-Emtricitab-Tenofov (Biktarvy )   Patient requested Delivery   Delivery date: 01/13/24   Verified address: [address] ([address] OFFICE) [place],  [place] [address]   Medication will be filled on 01/10/24.

## 2024-01-10 ENCOUNTER — Other Ambulatory Visit: Payer: Self-pay

## 2024-01-30 ENCOUNTER — Other Ambulatory Visit: Payer: Self-pay

## 2024-02-03 ENCOUNTER — Other Ambulatory Visit (HOSPITAL_COMMUNITY): Payer: Self-pay

## 2024-02-05 ENCOUNTER — Other Ambulatory Visit (HOSPITAL_COMMUNITY): Payer: Self-pay

## 2024-02-07 ENCOUNTER — Other Ambulatory Visit: Payer: Self-pay

## 2024-02-11 ENCOUNTER — Other Ambulatory Visit: Payer: Self-pay

## 2024-02-11 NOTE — Progress Notes (Signed)
 Specialty Pharmacy Refill Coordination Note  Kent Kramer is a 23 y.o. male contacted today regarding refills of specialty medication(s) Bictegravir-Emtricitab-Tenofov (Biktarvy )   Patient requested Delivery   Delivery date: 02/12/24   Verified address: [address] ([address] OFFICE) [place],  [place] [address]   Medication will be filled on: 02/11/24

## 2024-02-13 ENCOUNTER — Other Ambulatory Visit (HOSPITAL_COMMUNITY): Payer: Self-pay

## 2024-02-13 ENCOUNTER — Other Ambulatory Visit: Payer: Self-pay

## 2024-02-14 ENCOUNTER — Other Ambulatory Visit: Payer: Self-pay

## 2024-02-14 ENCOUNTER — Other Ambulatory Visit (HOSPITAL_COMMUNITY): Payer: Self-pay

## 2024-03-06 ENCOUNTER — Other Ambulatory Visit: Payer: Self-pay

## 2024-03-06 ENCOUNTER — Other Ambulatory Visit: Payer: Self-pay | Admitting: Infectious Diseases

## 2024-03-06 ENCOUNTER — Other Ambulatory Visit (HOSPITAL_COMMUNITY): Payer: Self-pay

## 2024-03-06 DIAGNOSIS — B20 Human immunodeficiency virus [HIV] disease: Secondary | ICD-10-CM

## 2024-03-09 ENCOUNTER — Other Ambulatory Visit (HOSPITAL_COMMUNITY): Payer: Self-pay

## 2024-03-11 ENCOUNTER — Other Ambulatory Visit: Payer: Self-pay

## 2024-03-11 ENCOUNTER — Other Ambulatory Visit: Payer: Self-pay | Admitting: Infectious Diseases

## 2024-03-11 DIAGNOSIS — Z79899 Other long term (current) drug therapy: Secondary | ICD-10-CM

## 2024-03-11 DIAGNOSIS — Z113 Encounter for screening for infections with a predominantly sexual mode of transmission: Secondary | ICD-10-CM

## 2024-03-11 DIAGNOSIS — B20 Human immunodeficiency virus [HIV] disease: Secondary | ICD-10-CM

## 2024-03-11 MED ORDER — BIKTARVY 50-200-25 MG PO TABS
1.0000 | ORAL_TABLET | Freq: Every day | ORAL | 0 refills | Status: DC
  Filled 2024-03-11 (×2): qty 30, 30d supply, fill #0

## 2024-03-11 NOTE — Progress Notes (Signed)
 Specialty Pharmacy Refill Coordination Note  Kent Kramer is a 23 y.o. male contacted today regarding refills of specialty medication(s) Bictegravir-Emtricitab-Tenofov (Biktarvy )   Patient requested Delivery   Delivery date: 03/13/24   Verified address: [address] ([address] OFFICE) [place],   [address]   Medication will be filled on: 03/12/24

## 2024-03-12 ENCOUNTER — Other Ambulatory Visit: Payer: Self-pay

## 2024-03-24 ENCOUNTER — Other Ambulatory Visit

## 2024-03-30 ENCOUNTER — Ambulatory Visit: Admitting: Infectious Diseases

## 2024-04-01 ENCOUNTER — Other Ambulatory Visit: Payer: Self-pay

## 2024-04-01 ENCOUNTER — Other Ambulatory Visit: Payer: Self-pay | Admitting: Infectious Diseases

## 2024-04-01 DIAGNOSIS — B20 Human immunodeficiency virus [HIV] disease: Secondary | ICD-10-CM

## 2024-04-06 ENCOUNTER — Other Ambulatory Visit: Payer: Self-pay

## 2024-04-06 MED ORDER — BIKTARVY 50-200-25 MG PO TABS
1.0000 | ORAL_TABLET | Freq: Every day | ORAL | 0 refills | Status: DC
  Filled 2024-04-06 – 2024-04-10 (×3): qty 30, 30d supply, fill #0

## 2024-04-07 ENCOUNTER — Other Ambulatory Visit (HOSPITAL_COMMUNITY)
Admission: RE | Admit: 2024-04-07 | Discharge: 2024-04-07 | Disposition: A | Discharge status: Home / Self Care | Source: Ambulatory Visit | Attending: Infectious Diseases | Admitting: Infectious Diseases

## 2024-04-07 ENCOUNTER — Other Ambulatory Visit: Payer: Self-pay

## 2024-04-07 ENCOUNTER — Other Ambulatory Visit

## 2024-04-07 DIAGNOSIS — Z113 Encounter for screening for infections with a predominantly sexual mode of transmission: Secondary | ICD-10-CM

## 2024-04-07 DIAGNOSIS — B20 Human immunodeficiency virus [HIV] disease: Secondary | ICD-10-CM | POA: Insufficient documentation

## 2024-04-07 DIAGNOSIS — Z79899 Other long term (current) drug therapy: Secondary | ICD-10-CM

## 2024-04-08 LAB — URINE CYTOLOGY ANCILLARY ONLY
Chlamydia: NEGATIVE
Comment: NEGATIVE
Comment: NORMAL
Neisseria Gonorrhea: NEGATIVE

## 2024-04-08 LAB — T-HELPER CELL (CD4) - (RCID CLINIC ONLY)
CD4 % Helper T Cell: 36 % (ref 33–65)
CD4 T Cell Abs: 575 /uL (ref 400–1790)

## 2024-04-09 ENCOUNTER — Other Ambulatory Visit (HOSPITAL_COMMUNITY): Payer: Self-pay

## 2024-04-09 LAB — CBC WITH DIFFERENTIAL/PLATELET
Absolute Lymphocytes: 2125 {cells}/uL (ref 850–3900)
Absolute Monocytes: 690 {cells}/uL (ref 200–950)
Basophils Absolute: 64 {cells}/uL (ref 0–200)
Basophils Relative: 0.7 %
Eosinophils Absolute: 156 {cells}/uL (ref 15–500)
Eosinophils Relative: 1.7 %
HCT: 45.7 % (ref 39.4–51.1)
Hemoglobin: 14.2 g/dL (ref 13.2–17.1)
MCH: 25.5 pg — ABNORMAL LOW (ref 27.0–33.0)
MCHC: 31.1 g/dL — ABNORMAL LOW (ref 31.6–35.4)
MCV: 82 fL (ref 81.4–101.7)
MPV: 10.1 fL (ref 7.5–12.5)
Monocytes Relative: 7.5 %
Neutro Abs: 6164 {cells}/uL (ref 1500–7800)
Neutrophils Relative %: 67 %
Platelets: 339 Thousand/uL (ref 140–400)
RBC: 5.57 Million/uL (ref 4.20–5.80)
RDW: 13.1 % (ref 11.0–15.0)
Total Lymphocyte: 23.1 %
WBC: 9.2 Thousand/uL (ref 3.8–10.8)

## 2024-04-09 LAB — LIPID PANEL
Cholesterol: 225 mg/dL — ABNORMAL HIGH
HDL: 42 mg/dL
LDL Cholesterol (Calc): 164 mg/dL — ABNORMAL HIGH
Non-HDL Cholesterol (Calc): 183 mg/dL — ABNORMAL HIGH
Total CHOL/HDL Ratio: 5.4 (calc) — ABNORMAL HIGH
Triglycerides: 88 mg/dL

## 2024-04-09 LAB — COMPLETE METABOLIC PANEL WITHOUT GFR
AG Ratio: 1.6 (calc) (ref 1.0–2.5)
ALT: 29 U/L (ref 9–46)
AST: 22 U/L (ref 10–40)
Albumin: 5 g/dL (ref 3.6–5.1)
Alkaline phosphatase (APISO): 59 U/L (ref 36–130)
BUN: 11 mg/dL (ref 7–25)
CO2: 23 mmol/L (ref 20–32)
Calcium: 9.6 mg/dL (ref 8.6–10.3)
Chloride: 100 mmol/L (ref 98–110)
Creat: 1.04 mg/dL (ref 0.60–1.24)
Globulin: 3.1 g/dL (ref 1.9–3.7)
Glucose, Bld: 146 mg/dL — ABNORMAL HIGH (ref 65–99)
Potassium: 3.8 mmol/L (ref 3.5–5.3)
Sodium: 137 mmol/L (ref 135–146)
Total Bilirubin: 0.5 mg/dL (ref 0.2–1.2)
Total Protein: 8.1 g/dL (ref 6.1–8.1)

## 2024-04-09 LAB — SYPHILIS: RPR W/REFLEX TO RPR TITER AND TREPONEMAL ANTIBODIES, TRADITIONAL SCREENING AND DIAGNOSIS ALGORITHM: RPR Ser Ql: NONREACTIVE

## 2024-04-09 LAB — HIV-1 RNA QUANT-NO REFLEX-BLD
HIV 1 RNA Quant: NOT DETECTED {copies}/mL
HIV-1 RNA Quant, Log: NOT DETECTED {Log_copies}/mL

## 2024-04-10 ENCOUNTER — Other Ambulatory Visit: Payer: Self-pay

## 2024-04-10 NOTE — Progress Notes (Signed)
 Specialty Pharmacy Refill Coordination Note  Kent Kramer is a 24 y.o. male contacted today regarding refills of specialty medication(s) Bictegravir-Emtricitab-Tenofov (Biktarvy )   Patient requested Delivery   Delivery date: 04/13/24   Verified address: [address] ([address]ASING OFFICE) [place],  [place] [address]   Medication will be filled on: 04/10/24

## 2024-04-15 ENCOUNTER — Ambulatory Visit: Payer: Self-pay | Admitting: Infectious Diseases

## 2024-04-21 ENCOUNTER — Ambulatory Visit: Admitting: Infectious Diseases

## 2024-05-01 ENCOUNTER — Other Ambulatory Visit: Payer: Self-pay | Admitting: Infectious Diseases

## 2024-05-01 ENCOUNTER — Other Ambulatory Visit: Payer: Self-pay

## 2024-05-01 DIAGNOSIS — B20 Human immunodeficiency virus [HIV] disease: Secondary | ICD-10-CM

## 2024-05-04 ENCOUNTER — Other Ambulatory Visit (HOSPITAL_COMMUNITY): Payer: Self-pay

## 2024-05-04 ENCOUNTER — Other Ambulatory Visit: Payer: Self-pay

## 2024-05-04 MED ORDER — BIKTARVY 50-200-25 MG PO TABS
1.0000 | ORAL_TABLET | Freq: Every day | ORAL | 0 refills | Status: AC
  Filled 2024-05-04 – 2024-05-05 (×2): qty 30, 30d supply, fill #0

## 2024-05-05 ENCOUNTER — Other Ambulatory Visit: Payer: Self-pay

## 2024-05-07 ENCOUNTER — Other Ambulatory Visit: Payer: Self-pay

## 2024-05-08 ENCOUNTER — Other Ambulatory Visit: Payer: Self-pay

## 2024-05-08 NOTE — Progress Notes (Signed)
 Specialty Pharmacy Refill Coordination Note  Kent Kramer is a 24 y.o. male contacted today regarding refills of specialty medication(s) Bictegravir-Emtricitab-Tenofov (Biktarvy )   Patient requested Delivery   Delivery date: 05/12/24   Verified address: [address] OFF[address]) [place],   [address]   Medication will be filled on: 05/11/24

## 2024-05-19 ENCOUNTER — Ambulatory Visit: Admitting: Infectious Diseases
